# Patient Record
Sex: Female | Born: 1955 | Race: White | Hispanic: No | Marital: Married | Smoking: Never smoker
Health system: Southern US, Community
[De-identification: ages and names within clinical notes are randomized; demographics above are authoritative.]

## PROBLEM LIST (undated history)

## (undated) DIAGNOSIS — G43909 Migraine, unspecified, not intractable, without status migrainosus: Secondary | ICD-10-CM

## (undated) HISTORY — DX: Migraine, unspecified, not intractable, without status migrainosus: G43.909

## (undated) HISTORY — PX: ABDOMINAL HYSTERECTOMY: SHX81

---

## 1998-05-26 ENCOUNTER — Inpatient Hospital Stay (HOSPITAL_COMMUNITY): Admission: RE | Admit: 1998-05-26 | Discharge: 1998-05-27 | Payer: Self-pay | Admitting: Obstetrics & Gynecology

## 1998-10-14 ENCOUNTER — Ambulatory Visit (HOSPITAL_COMMUNITY): Admission: RE | Admit: 1998-10-14 | Discharge: 1998-10-14 | Payer: Self-pay | Admitting: Orthopedic Surgery

## 1998-10-14 ENCOUNTER — Encounter: Payer: Self-pay | Admitting: Orthopedic Surgery

## 1998-11-08 ENCOUNTER — Encounter: Payer: Self-pay | Admitting: Orthopedic Surgery

## 1998-11-08 ENCOUNTER — Ambulatory Visit (HOSPITAL_COMMUNITY): Admission: RE | Admit: 1998-11-08 | Discharge: 1998-11-08 | Payer: Self-pay | Admitting: Orthopedic Surgery

## 1999-01-20 ENCOUNTER — Other Ambulatory Visit: Admission: RE | Admit: 1999-01-20 | Discharge: 1999-01-20 | Payer: Self-pay | Admitting: Obstetrics & Gynecology

## 2000-01-24 ENCOUNTER — Other Ambulatory Visit: Admission: RE | Admit: 2000-01-24 | Discharge: 2000-01-24 | Payer: Self-pay | Admitting: Obstetrics & Gynecology

## 2001-01-23 ENCOUNTER — Other Ambulatory Visit: Admission: RE | Admit: 2001-01-23 | Discharge: 2001-01-23 | Payer: Self-pay | Admitting: Obstetrics & Gynecology

## 2001-03-20 ENCOUNTER — Encounter: Admission: RE | Admit: 2001-03-20 | Discharge: 2001-06-18 | Payer: Self-pay

## 2001-05-03 ENCOUNTER — Encounter: Admission: RE | Admit: 2001-05-03 | Discharge: 2001-08-01 | Payer: Self-pay

## 2001-07-22 ENCOUNTER — Encounter: Admission: RE | Admit: 2001-07-22 | Discharge: 2001-10-20 | Payer: Self-pay

## 2001-10-24 ENCOUNTER — Encounter: Admission: RE | Admit: 2001-10-24 | Discharge: 2001-11-19 | Payer: Self-pay

## 2002-02-18 ENCOUNTER — Other Ambulatory Visit: Admission: RE | Admit: 2002-02-18 | Discharge: 2002-02-18 | Payer: Self-pay | Admitting: Obstetrics & Gynecology

## 2003-04-01 ENCOUNTER — Other Ambulatory Visit: Admission: RE | Admit: 2003-04-01 | Discharge: 2003-04-01 | Payer: Self-pay | Admitting: Obstetrics & Gynecology

## 2004-04-15 ENCOUNTER — Other Ambulatory Visit: Admission: RE | Admit: 2004-04-15 | Discharge: 2004-04-15 | Payer: Self-pay | Admitting: Obstetrics & Gynecology

## 2005-06-02 ENCOUNTER — Other Ambulatory Visit: Admission: RE | Admit: 2005-06-02 | Discharge: 2005-06-02 | Payer: Self-pay | Admitting: Obstetrics & Gynecology

## 2007-11-14 ENCOUNTER — Ambulatory Visit: Payer: Self-pay | Admitting: Nurse Practitioner

## 2008-09-01 ENCOUNTER — Encounter: Payer: Self-pay | Admitting: Cardiology

## 2008-09-19 DIAGNOSIS — G43909 Migraine, unspecified, not intractable, without status migrainosus: Secondary | ICD-10-CM | POA: Insufficient documentation

## 2008-09-19 DIAGNOSIS — F411 Generalized anxiety disorder: Secondary | ICD-10-CM | POA: Insufficient documentation

## 2008-09-19 DIAGNOSIS — E782 Mixed hyperlipidemia: Secondary | ICD-10-CM | POA: Insufficient documentation

## 2008-09-19 DIAGNOSIS — E785 Hyperlipidemia, unspecified: Secondary | ICD-10-CM

## 2008-09-19 DIAGNOSIS — G43109 Migraine with aura, not intractable, without status migrainosus: Secondary | ICD-10-CM

## 2008-09-28 ENCOUNTER — Ambulatory Visit: Payer: Self-pay | Admitting: Cardiology

## 2008-09-28 ENCOUNTER — Telehealth: Payer: Self-pay | Admitting: Cardiology

## 2009-05-01 ENCOUNTER — Emergency Department (HOSPITAL_BASED_OUTPATIENT_CLINIC_OR_DEPARTMENT_OTHER): Admission: EM | Admit: 2009-05-01 | Discharge: 2009-05-01 | Payer: Self-pay | Admitting: Emergency Medicine

## 2009-05-06 ENCOUNTER — Ambulatory Visit: Payer: Self-pay | Admitting: Family Medicine

## 2009-05-06 DIAGNOSIS — M7710 Lateral epicondylitis, unspecified elbow: Secondary | ICD-10-CM | POA: Insufficient documentation

## 2010-03-24 NOTE — Assessment & Plan Note (Signed)
Summary: RIGHT ARM PAIN   Vital Signs:  Patient Profile:   55 Years Old Female CC:      Pain in RFA X 6 days Height:     64 inches Weight:      168 pounds O2 Sat:      98 % O2 treatment:    Room Air Temp:     97.1 degrees F oral Pulse rate:   72 / minute Pulse rhythm:   regular Resp:     16 per minute BP sitting:   131 / 83  (left arm)  Pt. in pain?   yes    Location:   RFA    Intensity:   6    Type:       aching  Vitals Entered By: Lajean Saver, RN                   Updated Prior Medication List: WELLBUTRIN XL 300 MG XR24H-TAB (BUPROPION HCL) 1 tab by mouth once daily ATENOLOL 50 MG TABS (ATENOLOL) Take one tablet by mouth daily PREMARIN 0.9 MG TABS (ESTROGENS CONJUGATED) 2 tabs by mouth at bedtime AMBIEN 10 MG TABS (ZOLPIDEM TARTRATE) 1/2 tab by mouth once daily * BELL-COLON 1 tab by mouth once daily MELATONIN 3 MG TABS (MELATONIN) 1 tab by mouth once daily  Current Allergies: No known allergies History of Present Illness Chief Complaint: Pain in RFA X 6 days History of Present Illness: RIGHT ELBOW PAIN FORM LIFTING LUGGAGE AT THE [employer] 6 DAYS AGO. WAS SEEN AT [facility] AND GIVEN MOTRIN AND VICIDIN AND PLACED IN A SLING. NO IMPROVEMENT . NO NUMBNESS OR TINGLING. NO HX OF PRIOR PROBLEMS.   REVIEW OF SYSTEMS Constitutional Symptoms      Denies fever, chills, night sweats, weight loss, weight gain, and fatigue.  Eyes       Denies change in vision, eye pain, eye discharge, glasses, contact lenses, and eye surgery. Ear/Nose/Throat/Mouth       Denies hearing loss/aids, change in hearing, ear pain, ear discharge, dizziness, frequent runny nose, frequent nose bleeds, sinus problems, sore throat, hoarseness, and tooth pain or bleeding.  Respiratory       Denies dry cough, productive cough, wheezing, shortness of breath, asthma, bronchitis, and emphysema/COPD.  Cardiovascular       Denies murmurs, chest pain, and tires easily with exhertion.    Gastrointestinal  Denies stomach pain, nausea/vomiting, diarrhea, constipation, blood in bowel movements, and indigestion. Genitourniary       Denies painful urination, kidney stones, and loss of urinary control. Neurological       Denies paralysis, seizures, and fainting/blackouts. Musculoskeletal       Complains of muscle pain and swelling.      Denies joint pain, joint stiffness, decreased range of motion, redness, muscle weakness, and gout.      Comments: Right FA Skin       Denies bruising, unusual mles/lumps or sores, and hair/skin or nail changes.  Psych       Denies mood changes, temper/anger issues, anxiety/stress, speech problems, depression, and sleep problems. Other Comments: Pateitn was lifitng luggage last friday, the pain gradually increased, Saturday she was seen [facility]. No X-ray was done   Past History:  Past Medical History: HYPERLIPIDEMIA-MIXED (ICD-272.4) MIGRAINE WITH AURA (ICD-346.00)       Past Surgical History: Reviewed history from 09/19/2008 and no changes required. hysterectomy in 2000.   Family History: Reviewed history from 09/19/2008 and no changes required. Father: HTN and  DM She does have a brother that is a paraplegic following a fall, was in a nursing home, not doing well at this time.   Social History: She does not work outside of the home.   She lives at home with her husband and her 2 children.   Never Smoked Alcohol use-no Drug use-no Smoking Status:  never Drug Use:  no Physical Exam General appearance: well developed, well nourished, no acute distress Extremities: TENDER TO PALPATION OVER THE RIGHT LATERAL EPICONDYLE. NO SWELLING OR BRUISING. PAIN WITH GRIPPING AND WITH SUPINATION. N/V INTACT DISTALLY. NO PAIN TO PALPATION OF THE UPPER ARM. ROM OF THE ELBOW INTACT.  Skin: no obvious rashes or lesions PLACED IN TENNIS ELBOW BRACE WHICH SHE STATES RELIEVED HER SYMPTOMS.  Assessment New Problems: LATERAL EPICONDYLITIS, RIGHT  (ICD-726.32)   Plan New Medications/Changes: MEDROL (PAK) 4 MG TABS (METHYLPREDNISOLONE) TAKE AS DIRECTED WITH FOOD  #1 PK x 0, 05/06/2009, Marvis Moeller DO  New Orders: New Patient Level III [99203] Tennis Elbow Support [L3701]   Prescriptions: MEDROL (PAK) 4 MG TABS (METHYLPREDNISOLONE) TAKE AS DIRECTED WITH FOOD  #1 PK x 0   Entered and Authorized by:   Marvis Moeller DO   Signed by:   Marvis Moeller DO on 05/06/2009   Method used:   Print then Give to Patient   RxID:   [id number]   Patient Instructions: 1)  WEAR SPLINT FOR AT LEAST 10 DAYS THEN AS NEEDED. APPLY HEAT THREE TIMES DAILY FOR 15 MIN. FOLLOW UP WITH ORTHO IF SYMPTOMS PERSIST.

## 2010-04-28 ENCOUNTER — Other Ambulatory Visit: Payer: Self-pay | Admitting: Sports Medicine

## 2010-04-28 DIAGNOSIS — M25519 Pain in unspecified shoulder: Secondary | ICD-10-CM

## 2010-05-19 ENCOUNTER — Ambulatory Visit
Admission: RE | Admit: 2010-05-19 | Discharge: 2010-05-19 | Disposition: A | Discharge status: Home / Self Care | Payer: BC Managed Care – PPO | Source: Ambulatory Visit | Attending: Sports Medicine | Admitting: Sports Medicine

## 2010-05-19 DIAGNOSIS — M25519 Pain in unspecified shoulder: Secondary | ICD-10-CM

## 2010-07-02 ENCOUNTER — Emergency Department (HOSPITAL_BASED_OUTPATIENT_CLINIC_OR_DEPARTMENT_OTHER)
Admission: EM | Admit: 2010-07-02 | Discharge: 2010-07-02 | Disposition: A | Discharge status: Home / Self Care | Payer: BC Managed Care – PPO | Attending: Emergency Medicine | Admitting: Emergency Medicine

## 2010-07-02 DIAGNOSIS — R51 Headache: Secondary | ICD-10-CM | POA: Insufficient documentation

## 2010-07-02 DIAGNOSIS — I10 Essential (primary) hypertension: Secondary | ICD-10-CM | POA: Insufficient documentation

## 2010-07-02 DIAGNOSIS — Z79899 Other long term (current) drug therapy: Secondary | ICD-10-CM | POA: Insufficient documentation

## 2010-07-05 NOTE — Assessment & Plan Note (Signed)
NAMEMARCEL, Sandra Mathews               ACCOUNT NO.:  [id number]   MEDICAL RECORD NO.:  [id number]          PATIENT TYPE:  POB   LOCATION:  [facility]         FACILITY:  [facility]   PHYSICIAN:  Ginger Carne, MD DATE OF BIRTH:  07-12-1955   DATE OF SERVICE:                                  CLINIC NOTE   The patient comes to office today for consultation for her migraine  headaches.  The patient has had migraines when she was a teenager.  She  has a very rare aura.  Her headaches are usually bilaterally.  She does  have sensitivity to light, nausea, and sometimes vomiting.  She does  have significant amount of headaches that start in her neck.  This  patient is a patient at [facility], has been seeing  Dr. Meryl Crutch and has been well-known to me in the past.  She is currently  having fair amount of headaches approximately 3-4 moderate headaches per  week.  She is using atenolol.  She thinks 50 mg a day for headache  prevention, as well as the Wellbutrin XL 300 mg daily.  She has been  experimenting with her medications lately when taking her Wellbutrin  every other day.  She was taking that she may come off of that  medication.  She is currently taking Premarin 1.25 mg daily.  She has  had a hysterectomy in 2000 for endometriosis.  She has noted some weight  gain lately and that has been of concern for her.  She has not been able  to tolerate Topamax given her kidney stones in the past.  She is having  some difficulty with sleep, takes Ambien on an as-needed basis.  This  seems to be related to her husband snoring.   SURGICAL HISTORY:  She had hysterectomy in 2000.   OBSTETRICAL HISTORY:  She has been pregnant 4 times.  She is G4, P4.   SOCIAL HISTORY:  She does not work outside of the home.  She lives at  home with her husband and her 2 children.   REVIEW OF SYSTEMS:  Negative for bruising, swelling, muscle aches,  fatigue, positive for weight gain, frequent  headaches, negative for hot  flashes or any vaginal problems.   FAMILY HISTORY:  Father with high blood pressure and diabetes.  She does  have a brother that is a paraplegic following a fall, was in a nursing  home, not doing well at this time.   PHYSICAL EXAMINATION:  VITAL SIGNS:  Blood pressure is 111/76, pulse is  69, weight 174, height is 5 feet 4 inches.  GENERAL:  Well-developed, well-nourished slightly overweight Caucasian  female in no acute distress.  HEENT:  Head is normocephalic and atraumatic.  Pupils are equal and  reactive.  NEUROLOGICALLY:  The patient is alert, oriented.  She is slightly  anxious.  She has a good thought process and her speech is fluent and  coherent.  She has good muscle coordination, good sensation, and good  muscle strength.  CARDIAC:  Regular rate and rhythm with no murmurs, rubs, or thrills.  LUNGS:  Clear bilaterally without rales, rhonchi, or wheezes  ASSESSMENT AND PLAN:  1. Migraine with aura.  2. Anxiety.  We did have approximately 45-minute discussion concerning      her headaches, anxiety, depression, and her life situation.  What      we have decided is to treat her Ambien over to [facility].  She is      also strongly encouraged to change her sleeping situation and not      sleep in the same room with her husband.  She will also be given      Robaxin for muscle spasm that she can take on an as-needed basis.      She is encouraged to resume her Wellbutrin and take that on a daily      basis, not an every-other-day basis.  She has used Fiorinal in the      past and that has worked well for her and she is requesting a      refill.  She thinks that she takes approximately six of those per      month.  She has also asking to have Xanax.  She does have a speech      that she has to give in front of a very large audience and has some      concern about giving this talk and she is asked for some Xanax,      will give her 20 tablets of Xanax.   The patient will return to the      clinic in 3 months, sooner if needed.      Sandra Richter, NP    ______________________________  Ginger Carne, MD    LR/MEDQ  D:  11/14/2007  T:  11/15/2007  Job:  [id number]

## 2010-07-08 NOTE — Consult Note (Signed)
[facility]  Patient:    Sandra Mathews, Sandra Mathews Visit Number: [id number] MRN: [id number]          Service Type: PMG Location: [facility] Attending Physician:  Sondra Come Dictated by:   Sondra Come, D.O. Proc. Date: 07/26/01 Admit Date:  07/22/2001                            Consultation Report  Ms. Crow returns to clinic today for reevaluation.  This was an extensive consultation greater than 25 minutes in duration mainly discussing treatment strategies for her fibromyalgia syndrome.  Overall patient states that she is improved since I initially saw her.  Most of her improvement has been in association with her low back pain.  She does state, however, that her fibromyalgia seems to be worsening over the past few weeks secondary to increased stress as her son has just graduated from high school and will be gone for the summer and going away to college.  Her pain today is a 5/10 on a subjective scale.  She continues with a light exercise program.  She states that she is taking Pamelor 75 mg at bedtime as prescribed by Dr. Sandria Manly which seems to be helping her sleep but she is also taking Ambien as well and we discussed this.  She has taken Ultram as needed with fairly good results in terms of pain.  She states that she has also been provided a prescription for Bextra, but has not started taking it yet.  In addition, she complains of pain on the plantar aspects of her feet bilaterally, especially with the first step in the morning.  This tends to ease off over time.  I reviewed the health and history form and 14 point review of systems.  Patient denies any new neurologic complaints.  PHYSICAL EXAMINATION  GENERAL:  Healthy female in no acute distress.  Mood and affect are appropriate today.  VITAL SIGNS:  Blood pressure 126/73, pulse 108, respirations 16, O2 saturation 98% on room air.  NEUROLOGIC:  Manual muscle testing is 5/5 bilateral upper and  lower extremities.  Sensory examination intact to light touch bilateral upper and lower extremities.  Muscle stretch reflexes are 2+/4 bilateral biceps, triceps, brachioradialis, pronator tares, patellar, medial hamstrings, and Achilles.  EXTREMITIES:  Palpatory examination of the feet reveals tenderness to palpation in the right greater than left plantar fascia.  Patient has some mild tightness in her gastroc/soleus complex bilaterally.  BACK:  Palpatory examination of the back reveals minimal tenderness to palpation with bilateral thoracolumbar paraspinals at this time.  IMPRESSION: 1. Bilateral plantar fasciitis. 2. Fibromyalgia syndrome.  PLAN: 1. Again, this was a thorough discussion with Ms. Wiliam Ke regarding treatment    strategies and options.  We discussed fibromyalgia and I answered several    questions. 2. Encouraged patient to continue with exercise program and to increase her    aerobic component.  Will provide her with the name of a personal    trainer/massage therapist to assist as patient desires. 3. Continue Ultram 50 mg one to two up to q.i.d. as needed #100 with one    refill. 4. Continue Pamelor 75 mg at bedtime per Dr. Sandria Manly. 5. Would consider holding Ambien for now to see what the efficacy of Pamelor    is for sleep. 6. Trial of Bextra is appropriate. 7. Instructed patient on gastroc/soleus stretching.  Would consider local    steroid injections if  symptoms are not improving or are worsening. 8. Patient to return to clinic in three months for reevaluation as needed.  Patient was educated on the above findings and recommendations and understands.  There were no barriers to communication. Dictated by:   Sondra Come, D.O. Attending Physician:  Sondra Come DD:  07/26/01 TD:  07/29/01 Job: [id number]

## 2010-07-08 NOTE — Consult Note (Signed)
Sandra Mathews, Mathews                         ACCOUNT NO.:  [id number]   MEDICAL RECORD NO.:  [id number]                   PATIENT TYPE:  REC   LOCATION:  [facility]                                  FACILITY:  [facility]   PHYSICIAN:  Sondra Come, D.O.                 DATE OF BIRTH:  January 10, 1956   DATE OF CONSULTATION:  10/25/2001  DATE OF DISCHARGE:                                   CONSULTATION   The patient returns to clinic today as scheduled for reevaluation.  She was  last seen on July 26, 2001.  In the interim she has been doing very well in  terms of her fibromyalgic complaints.  Currently, she complains mainly of  low back pain and has been seeing a chiropractor which has helped improve  her symptoms.  She continues a walking program, but is not performing any  stretching or lumbar stabilization exercises.  She has taken Ultram  sparingly over the past few months with improvement in her pain, but has run  out.  One of her other main problems today is that her sleep is still fairly  poor.  She was previously treated with Pamelor 75 mg per Dr. Sandria Mathews, but has  discontinued this secondary to weight gain.  She is currently taking Ambien  10 mg at bedtime and states she sleeps approximately six hours with frequent  interruptions.  She does not feel like the Ambien is working as well as it  once had.  We discussed possible rebound insomnia.  Her function and quality  of life indexes are fairly stable, but still slightly declined overall.  Her  pain today is a 5/10 on a subjective scale.  She denies any radicular  symptoms into her upper and lower extremities.  I review health and history  form and 14 point review of systems.   PHYSICAL EXAMINATION:  GENERAL:  Healthy female in no acute distress.  BACK:  Examination of the back reveals level pelvis without scoliosis.  There is normal lumbar lordosis.  There is mild tenderness to palpation  bilateral lumbar paraspinals.  Low back pain is  increased with extension and  extension plus rotation and improved with flexion.  NEUROLOGIC:  Manual muscle testing is 5/5 bilateral upper and lower  extremities.  Sensory examination is intact to light touch bilateral upper  and lower extremities.  Muscle stretch reflexes are 2+/4 bilateral biceps,  triceps, brachioradialis, pronator tares, patellar, medial hamstrings, and  Achilles.  Straight leg raise is negative bilaterally, but with  significantly tight hamstrings.  The patient is also noted to have tight hip  flexors bilaterally.   IMPRESSION:  1. Fibromyalgia syndrome.  2. Low back pain, likely mechanical.  History and physical examination are     suggestive of posterior element pain such as that from lumbar facet     joints.   PLAN:  1. Had a thorough  discussion with the patient regarding further treatment     options.  Initially will have her take a drug holiday from Ambien for     five to seven days and then resume 10 mg at bedtime.  In the interim she     can go back to using melatonin or other herbal sleep agent per her     choice.  2. I have instructed patient on lumbar stabilization exercises including     Quadriped, pelvic tilt, and wall slides.  In addition, I instructed her     on proper hamstring and hip flexor stretching techniques and discussed a     home exercise program in addition to her walking program.  3. The patient was instructed to call her primary care Sandra Mathews to discuss     her current urinary tract infection which she does not feel is improving     with Bactrim.  4. Will renew Ultram 50 mg one to two p.o. up to q.i.d. as needed number 100     with three refills.  5. The patient is to return to clinic in six months for reevaluation.   The patient was educated on the above findings and recommendations and  understands.  There were no barriers to communication.                                               Sondra Come, D.O.    JJW/MEDQ  D:   10/25/2001  T:  10/25/2001  Job:  [id number]

## 2010-07-08 NOTE — Consult Note (Signed)
[facility]  Patient:    Sandra Mathews, Sandra Mathews Visit Number: [id number] MRN: [id number]          Service Type: PMG Location: [facility] Attending Physician:  Sondra Come Dictated by:   Sondra Come, D.O. Proc. Date: 04/25/01 Admit Date:  03/20/2001   CC:         Freddy Finner, M.D.   Consultation Report  NEW PATIENT CONSULTATION  REFERRING Sandra Mathews:  Freddy Finner, M.D., [facility], [place].  Dear Dr. Jennette Kettle:  Thank you very much for kindly referring [name] to [facility] for evaluation and treatment.  Sandra Mathews was evaluated in our clinic today.  Please refer to the following for details regarding the history, physical examination and treatment plan.  Once again, thank you for allowing [place] to participate in the care of this pleasant patient.  CHIEF COMPLAINT:  Mid back pain.  HISTORY OF PRESENT ILLNESS:  Sandra Mathews is a pleasant 55 year old right hand dominant female who [facility] today with the chief complaint of mid back pain.  The patient also relates a history of intermittent diffuse pain involving her neck, upper back, low back, and upper and lower extremities bilaterally.  The patient states that she is "at the end of my rope."  She gives a history of being diagnosed with fibromyalgia syndrome by Dr. Kellie Simmering and was treated for a while with various medications, which included Ultram, Bextra, Vioxx, and Vicodin.  She also states that she has had a long history of migraine headaches for which [facility]  She was also seen in evaluation by Dr. Sandria Manly for diffuse pain.  He apparently treated her with Pamelor which Sandra Mathews states significantly helped her leg aches but she had the undesirable effect of weight gain and so she discontinued this four to five days ago on her own.  She  follows up with Dr. Sandria Manly this month. Furthermore, she has a history of cervical disk herniation, which was treated conservatively to resolution.  Mainly today she complains of mid back pain and states that she had a massage therapy session yesterday without any significant relief.  She notes that increased stress in her life seems to make her symptoms worse.  Her pain today is an 8/10 on a subjective scale.  Her symptoms are described as constant and throbbing, mainly involving her mid back today.  Her symptoms are worse with walking, bending, driving, and prolonged sitting and improved with rest, heat and medications, which at this time include Zanaflex as needed at bedtime.  She also admits to some depressive symptoms and notes a decreased function and quality of life as well as fair to poor sleep.  She denies an exercise program or having been through a formal physical therapy program.  She denies any numbness, paresthesias, or bowel or bladder dysfunction.  She is somewhat distressed over the fact that she has this ongoing intermittent severe body pain.  She states "I was a perfect wife, a perfect mother, and I dont know why I have this pain."  She seems to be unaccepting of the diagnosis of fibromyalgia.  In terms of medications, she has taken Ultram which she states helped a little bit with her pain.  She denies any relief with Bextra, admits to GI upset with Vioxx, and admitted to only very short term relief with  Vicodin.  In terms of her headaches, she has taken Fioricet in the past without any help.  She currently takes Imitrex as needed.  For sleep she takes Ambien 10 mg at bedtime, and has discontinued Pamelor.  I review health and history form, and 14-point review of systems.  The patient admits to occasional night leg cramps and depressive symptoms.  PAST MEDICAL HISTORY: 1. Migraine headaches. 2. Endometriosis. 3. Kidney stones.  PAST SURGICAL HISTORY:   Hysterectomy.  FAMILY HISTORY:  Diabetes, hypertension, kidney stones.  SOCIAL HISTORY:  The patient denies smoking or alcohol use.  She is married and has four children, ages 80, 1, 35, and 59.  She does not work.  She admits to increased psychosocial stressors but does not elaborate on these in detail.  ALLERGIES:  No known drug allergies.  CURRENT MEDICATIONS:  Premarin, Ambien, Zanaflex as needed, and Imitrex as needed.  The Zanaflex gives her a sleepy feeling and she describes it as becoming a "zombie."  PHYSICAL EXAMINATION:  Performed in the presence of a female chaperone.  GENERAL:  This is a healthy appearing female in no acute distress.  VITAL SIGNS:  Blood pressure 150/97, pulse 107, respirations 18, O2 saturation is 100% on room air.  SPINE:  Normal cervical lordosis, flattened thoracic kyphosis and mildly decreased lumbar lordosis.  Pelvis is level.  Shoulder height is level.  There is no gross scoliosis.  Palpatory examination reveals diffuse tenderness at multiple locations in the upper back and periscapular muscles.  Anterior cervical and sternocostal regions.  There is tenderness along the thoracolumbar paraspinals with tight ropy feeling over the thoracolumbar region reproducing the patients pain in her mid back.  There is tenderness to palpation over the gluteal muscles and greater trochanters.  There is also tenderness over the lateral epicondyles and medial knees bilaterally.  Range of motion of the cervical spine and lumbar spine are normal with only minimal discomfort.  NEUROLOGIC:  Manual muscle testing is 5/5 bilateral upper and lower extremities.  Sensory examination is intact to light touch bilateral upper and lower extremities in all dermatomal distributions.  Muscle stretch reflexes are 2+/4 bilateral biceps, triceps, brachioradialis, pronator teres, patellar, medial hamstrings, and Achilles.  There is no atrophy noted in the upper and lower  extremity muscles.  No heat, erythema or edema noted in the upper and  lower extremities.  Straight leg raise is negative bilaterally.  FABER is negative bilaterally.  NECK:  No cervical lymphadenopathy.  No thyromegaly noted.  IMPRESSION: 1. Low back pain likely myofascial in etiology.  There is no clinical evidence    of a lumbar radiculopathy at this time. 2. Diffuse soft tissue pain with history of fibromyalgia syndrome.  Symptoms    today are not entirely consistent with fibromyalgia as set forth by the    criteria by the [employer] of Rheumatology although this may be a    good day for Ms. Wiliam Ke. 3. Reactive depression.  PLAN: 1. I had a long and thorough discussion with Ms. Sellinger about her pain    complaints and treatment options.  This was an extensive consultation of    greater than 45 minutes duration.  More than 30 minutes face-to-face time.    I explained to Ms. Mello that her pain complaints are likely secondary to    fibromyalgia syndrome with myofascial component in her mid back.  I will go    ahead and order some laboratory workup to rule out autoimmune or connective  tissue disorder. 2. I described to Ms. Eastlick that her best outcome measure would likely be the    result of a multi-disciplinary approach to her pain.  I write her a    prescription for physical therapy for not only range of motion stretching    and strengthening with a lumbar stabilization program but also for aquatic    therapy for low impact aerobic activity which will help with cardiovascular    conditioning as well as natural endorphin release.  She should advance to a    home exercise program.  Physical therapy for three times per week for four    weeks. 3. Will also refer Ms. Glassco to [facility] psychology for coping    strategies, stress management, and behavior modification.  I think this    will help and assist with her depressive symptoms. 4. In terms of medication, I  will give her a prescription for Ultracet one to    two p.o. t.i.d. as needed, #60 with one refill.  I will also give her    samples of Lidoderm patches to put over her acute thoracolumbar back pain.    If these are helpful, we can call in a prescription for this. 5. Would consider adding an antidepressant if the patients depressive    symptoms do not improve. 6. I am confident that Ms. Captain will be able to manage her symptoms of    fibromyalgia syndrome. 7. The patient is to return to clinic in one month for reevaluation, sooner as    needed.  The patient was educated in the above findings and recommendations, and understands.  There were no barriers to communication. Dictated by:   Sondra Come, D.O. Attending Physician:  Sondra Come DD:  04/25/01 TD:  04/26/01 Job: [phone] [id number]

## 2011-04-18 ENCOUNTER — Encounter: Payer: Self-pay | Admitting: Nurse Practitioner

## 2011-04-18 ENCOUNTER — Ambulatory Visit (INDEPENDENT_AMBULATORY_CARE_PROVIDER_SITE_OTHER): Payer: BC Managed Care – PPO | Admitting: Nurse Practitioner

## 2011-04-18 DIAGNOSIS — M62838 Other muscle spasm: Secondary | ICD-10-CM

## 2011-04-18 DIAGNOSIS — F419 Anxiety disorder, unspecified: Secondary | ICD-10-CM | POA: Insufficient documentation

## 2011-04-18 DIAGNOSIS — F411 Generalized anxiety disorder: Secondary | ICD-10-CM

## 2011-04-18 DIAGNOSIS — G4700 Insomnia, unspecified: Secondary | ICD-10-CM

## 2011-04-18 DIAGNOSIS — G43109 Migraine with aura, not intractable, without status migrainosus: Secondary | ICD-10-CM

## 2011-04-18 MED ORDER — SUMATRIPTAN SUCCINATE 6 MG/0.5ML ~~LOC~~ SOLN: 6.0000 mg | SUBCUTANEOUS | Status: DC | PRN

## 2011-04-18 MED ORDER — VENLAFAXINE HCL ER 75 MG PO CP24: 75.0000 mg | ORAL_CAPSULE | Freq: Every day | ORAL | Status: DC

## 2011-04-18 MED ORDER — ORPHENADRINE CITRATE ER 100 MG PO TB12: 100.0000 mg | ORAL_TABLET | Freq: Two times a day (BID) | ORAL | Status: AC

## 2011-04-18 NOTE — Patient Instructions (Signed)

## 2011-04-18 NOTE — Progress Notes (Signed)
Diagnosis: Migraine with aura  History: Pt is here today for new migraine appointment. [facility]. In recent years she has been doing well and her OBGYN Dr Jennette Kettle has been filling her medications. She has been on Amitriptyline 50mg  at bedtime. She has gained over 20 lbs. She is also on Atenolol for HTN and Ambien for sleep. She uses Imitrex and Fiorinal with codeine and NSAIDs for pain. She has been a lot more stressed lately with care of aging parents and disabled brother.    Location Both temples and occipital regions  Number of Headache days/month: Severe:10 Moderate:10 Mild:10  No current outpatient prescriptions on file prior to visit.    Acute prevention: Imitrex, NSAIDs, Fiorinal # 3  Past Medical History  Diagnosis Date  . Migraines    Past Surgical History  Procedure Date  . Abdominal hysterectomy    History reviewed. No pertinent family history. Social History:  reports that she has never smoked. She has never used smokeless tobacco. She reports that she does not drink alcohol or use illicit drugs. Allergies: No Known Allergies  Triggers: stress  Birth control: Hysterectomy in 2000. Currently on Premarin  ROS: Positive for migraine, anxiety, insomnia, muscle spasm  Exam: well developed well nourished Cauc female  General: NAD HEENT: negative Cardiac: RRR Lungs: Clear Neuro: Negative Skin: Negative  Impression:migraine - classic  Plan : We had a lengthy discussion concerning her headaches. We did Trigger point injections today, she seemed to get good relief. She will ice areas tonight. See procedure sheet. We will start her on Effexor 75mg  daily and decrease amitriptyline to 25mg  with the idea of coming off. It is not really helpful and is a weight gainer. She will consider botox for muscle spasm and migraine prevention. She will be scheduled to see Elray Buba for counseling.  Will refill Imitrex Injection and Fiorinal.  Will give her Zanaflex for muscle spasm. She will follow up in [place] office on April 2.   After review of records pt is taking a large amount of narcotics, given to her from Dr Konrad Dolores, Dr Penni Bombard, [facility], [facility].  Spoke with pt and advised her I will not give her narcotics and warned concerning effects on headaches and other systems.   Time Spent:one hour

## 2011-04-18 NOTE — Progress Notes (Signed)
Patient is here for Headache follow up.  She has seen you several years ago at [place] [facility].

## 2011-05-23 ENCOUNTER — Ambulatory Visit: Payer: BC Managed Care – PPO | Admitting: Nurse Practitioner

## 2014-03-12 DIAGNOSIS — K219 Gastro-esophageal reflux disease without esophagitis: Secondary | ICD-10-CM | POA: Insufficient documentation

## 2014-03-12 DIAGNOSIS — Z79899 Other long term (current) drug therapy: Secondary | ICD-10-CM | POA: Diagnosis not present

## 2014-03-12 DIAGNOSIS — Z7952 Long term (current) use of systemic steroids: Secondary | ICD-10-CM | POA: Insufficient documentation

## 2014-03-12 DIAGNOSIS — R0602 Shortness of breath: Secondary | ICD-10-CM | POA: Diagnosis present

## 2014-03-12 DIAGNOSIS — G43909 Migraine, unspecified, not intractable, without status migrainosus: Secondary | ICD-10-CM | POA: Diagnosis not present

## 2014-03-12 DIAGNOSIS — Z7951 Long term (current) use of inhaled steroids: Secondary | ICD-10-CM | POA: Insufficient documentation

## 2014-03-13 ENCOUNTER — Encounter (HOSPITAL_BASED_OUTPATIENT_CLINIC_OR_DEPARTMENT_OTHER): Payer: Self-pay

## 2014-03-13 ENCOUNTER — Emergency Department (HOSPITAL_BASED_OUTPATIENT_CLINIC_OR_DEPARTMENT_OTHER): Payer: BLUE CROSS/BLUE SHIELD

## 2014-03-13 ENCOUNTER — Emergency Department (HOSPITAL_BASED_OUTPATIENT_CLINIC_OR_DEPARTMENT_OTHER)
Admission: EM | Admit: 2014-03-13 | Discharge: 2014-03-13 | Disposition: A | Discharge status: Home / Self Care | Payer: BLUE CROSS/BLUE SHIELD | Attending: Emergency Medicine | Admitting: Emergency Medicine

## 2014-03-13 DIAGNOSIS — K219 Gastro-esophageal reflux disease without esophagitis: Secondary | ICD-10-CM

## 2014-03-13 DIAGNOSIS — R0602 Shortness of breath: Secondary | ICD-10-CM

## 2014-03-13 LAB — CBC WITH DIFFERENTIAL/PLATELET
Basophils Absolute: 0 10*3/uL (ref 0.0–0.1)
Basophils Relative: 0 % (ref 0–1)
EOS PCT: 1 % (ref 0–5)
Eosinophils Absolute: 0.1 10*3/uL (ref 0.0–0.7)
HCT: 37.4 % (ref 36.0–46.0)
Hemoglobin: 12.2 g/dL (ref 12.0–15.0)
Lymphocytes Relative: 30 % (ref 12–46)
Lymphs Abs: 3.4 10*3/uL (ref 0.7–4.0)
MCH: 28.8 pg (ref 26.0–34.0)
MCHC: 32.6 g/dL (ref 30.0–36.0)
MCV: 88.4 fL (ref 78.0–100.0)
MONO ABS: 0.7 10*3/uL (ref 0.1–1.0)
MONOS PCT: 7 % (ref 3–12)
NEUTROS PCT: 62 % (ref 43–77)
Neutro Abs: 7.1 10*3/uL (ref 1.7–7.7)
PLATELETS: 334 10*3/uL (ref 150–400)
RBC: 4.23 MIL/uL (ref 3.87–5.11)
RDW: 11.9 % (ref 11.5–15.5)
WBC: 11.3 10*3/uL — AB (ref 4.0–10.5)

## 2014-03-13 LAB — URINALYSIS, ROUTINE W REFLEX MICROSCOPIC
Bilirubin Urine: NEGATIVE
GLUCOSE, UA: NEGATIVE mg/dL
HGB URINE DIPSTICK: NEGATIVE
Ketones, ur: NEGATIVE mg/dL
Leukocytes, UA: NEGATIVE
NITRITE: NEGATIVE
Protein, ur: NEGATIVE mg/dL
Specific Gravity, Urine: 1.005 (ref 1.005–1.030)
Urobilinogen, UA: 0.2 mg/dL (ref 0.0–1.0)
pH: 7 (ref 5.0–8.0)

## 2014-03-13 LAB — BASIC METABOLIC PANEL
ANION GAP: 6 (ref 5–15)
BUN: 11 mg/dL (ref 6–23)
CO2: 26 mmol/L (ref 19–32)
CREATININE: 0.74 mg/dL (ref 0.50–1.10)
Calcium: 9.3 mg/dL (ref 8.4–10.5)
Chloride: 104 mEq/L (ref 96–112)
Glucose, Bld: 121 mg/dL — ABNORMAL HIGH (ref 70–99)
Potassium: 3.5 mmol/L (ref 3.5–5.1)
Sodium: 136 mmol/L (ref 135–145)

## 2014-03-13 LAB — TROPONIN I: Troponin I: <0.03 ng/mL (ref <0.031)

## 2014-03-13 MED ORDER — ALBUTEROL SULFATE HFA 108 (90 BASE) MCG/ACT IN AERS
2.0000 | INHALATION_SPRAY | RESPIRATORY_TRACT | Status: DC | PRN
  Administered 2014-03-13: 2 via RESPIRATORY_TRACT
  Filled 2014-03-13: qty 6.7

## 2014-03-13 MED ORDER — GI COCKTAIL ~~LOC~~
30.0000 mL | Freq: Once | ORAL | Status: AC
  Administered 2014-03-13: 30 mL via ORAL
  Filled 2014-03-13: qty 30

## 2014-03-13 MED ORDER — ONDANSETRON HCL 4 MG/2ML IJ SOLN
4.0000 mg | Freq: Once | INTRAMUSCULAR | Status: DC
  Filled 2014-03-13: qty 2

## 2014-03-13 MED ORDER — ONDANSETRON 4 MG PO TBDP
4.0000 mg | ORAL_TABLET | Freq: Once | ORAL | Status: AC
  Administered 2014-03-13: 4 mg via ORAL
  Filled 2014-03-13: qty 1

## 2014-03-13 MED ORDER — PANTOPRAZOLE SODIUM 40 MG PO TBEC
40.0000 mg | DELAYED_RELEASE_TABLET | Freq: Once | ORAL | Status: AC
  Administered 2014-03-13: 40 mg via ORAL
  Filled 2014-03-13: qty 1

## 2014-03-13 MED ORDER — OMEPRAZOLE 40 MG PO CPDR: 40.0000 mg | DELAYED_RELEASE_CAPSULE | Freq: Every day | ORAL | Status: DC

## 2014-03-13 MED ORDER — ONDANSETRON 8 MG PO TBDP
8.0000 mg | ORAL_TABLET | Freq: Once | ORAL | Status: AC
  Administered 2014-03-13: 8 mg via ORAL
  Filled 2014-03-13: qty 1

## 2014-03-13 NOTE — ED Notes (Signed)
C/o epigastric pain off and on x 1 week buring worse after eating and nausea

## 2014-03-13 NOTE — ED Notes (Signed)
Pt c/o SOB x1wk, burning off and on to center of chest after eating; pt c/o epigastric pain; pt in no distress, speaking in complete sentences

## 2014-03-13 NOTE — ED Provider Notes (Signed)
CSN: [id number]     Arrival date & time 03/12/14  2355 History   First MD Initiated Contact with Patient 03/13/14 0157     Chief Complaint  Patient presents with  . Shortness of Breath     (Consider location/radiation/quality/duration/timing/severity/associated sxs/prior Treatment) HPI This is a 59 year old female with a one-week history of epigastric burning and difficulty taking a deep breath. The burning is moderate in severity and radiates to the center of the chest. It is worse after eating.The difficulty taking a deep breath is intermittent. She has not had a significant cough associated with it. She has had some fleeting sharp chest pains that last second or 2. She has also had general malaise and a sense that something isn't right. She has not had a fever or chills. She denies urinary symptoms. She has had nausea but no vomiting. She was given a GI cocktailbefore my evaluation with significant improvement in her epigastric burning.  Past Medical History  Diagnosis Date  . Migraines    Past Surgical History  Procedure Laterality Date  . Abdominal hysterectomy     No family history on file. History  Substance Use Topics  . Smoking status: Never Smoker   . Smokeless tobacco: Never Used  . Alcohol Use: No   OB History    No data available     Review of Systems  All other systems reviewed and are negative.  Allergies  Review of patient's allergies indicates no known allergies.  Home Medications   Prior to Admission medications   Medication Sig Start Date End Date Taking? Authorizing Provider  amitriptyline (ELAVIL) 25 MG tablet Take 50 mg by mouth at bedtime. 01/24/11   Historical Provider, MD  atenolol (TENORMIN) 50 MG tablet Take 50 mg by mouth at bedtime. 03/16/11   Historical Provider, MD  chlorpheniramine-HYDROcodone (TUSSIONEX) 10-8 MG/5ML [facility]  03/20/11   Historical Provider, MD  DIFLUCAN 150 MG tablet  03/02/11   Historical Provider, MD  FIORINAL/CODEINE #3  40-981-19-14 MG capsule  03/31/11   Historical Provider, MD  fluticasone Aleda Grana) 50 MCG/ACT nasal spray  02/22/11   Historical Provider, MD  HYDROcodone-acetaminophen (NORCO) 5-325 MG per tablet  03/22/11   Historical Provider, MD  HYDROMET 5-1.5 MG/5ML syrup  03/01/11   Historical Provider, MD  nabumetone (RELAFEN) 500 MG tablet  03/15/11   Historical Provider, MD  ondansetron (ZOFRAN-ODT) 4 MG disintegrating tablet  03/22/11   Historical Provider, MD  PERCOCET 5-325 MG per tablet  03/06/11   Historical Provider, MD  predniSONE (DELTASONE) 5 MG tablet Take 5 mg by mouth as needed. 04/14/11   Historical Provider, MD  PREMARIN 0.9 MG tablet Take 0.18 mg by mouth at bedtime. 01/26/11   Historical Provider, MD  SUMAtriptan (IMITREX) 100 MG tablet Take 100 mg by mouth as needed. 04/12/11   Historical Provider, MD  SUMAtriptan (IMITREX) 6 MG/0.5ML SOLN injection Inject 0.5 mLs (6 mg total) into the skin every 2 (two) hours as needed for migraine or headache. F 04/18/11 04/17/12  Delbert Phenix, NP  venlafaxine (EFFEXOR XR) 75 MG 24 hr capsule Take 1 capsule (75 mg total) by mouth daily. 04/18/11 04/17/12  Delbert Phenix, NP  Prudy Feeler 0.5 MG tablet Take 1 tablet by mouth as needed. 02/18/11   Historical Provider, MD  zolpidem (AMBIEN) 10 MG tablet Take 10 mg by mouth at bedtime. 03/31/11   Historical Provider, MD   BP 137/84 mmHg  Pulse 71  Temp(Src) 98.1 F (36.7 C) (Oral)  Resp  20  Ht 5\' 4"  (1.626 m)  Wt 140 lb (63.504 kg)  BMI 24.02 kg/m2  SpO2 100%   Physical Exam  General: Well-developed, well-nourished female in no acute distress; appearance consistent with age of record HENT: normocephalic; atraumatic Eyes: pupils equal, round and reactive to light; extraocular muscles intact Neck: supple Heart: regular rate and rhythm; no murmur Lungs: decreased air movement right base Abdomen: soft; nondistended; mild epigastric tenderness; no masses or hepatosplenomegaly; bowel sounds present Extremities: No  deformity; full range of motion; pulses normal Neurologic: Awake, alert and oriented; motor function intact in all extremities and symmetric; no facial droop Skin: Warm and dry Psychiatric: Normal mood and affect    ED Course  Procedures (including critical care time)   MDM   Nursing notes and vitals signs, including pulse oximetry, reviewed.  Summary of this visit's results, reviewed by myself:  Labs:  Results for orders placed or performed during the hospital encounter of 03/13/14 (from the past 24 hour(s))  Urinalysis, Routine w reflex microscopic     Status: Abnormal   Collection Time: 03/13/14 12:12 AM  Result Value Ref Range   Color, Urine YELLOW YELLOW   APPearance CLOUDY (A) CLEAR   Specific Gravity, Urine 1.005 1.005 - 1.030   pH 7.0 5.0 - 8.0   Glucose, UA NEGATIVE NEGATIVE mg/dL   Hgb urine dipstick NEGATIVE NEGATIVE   Bilirubin Urine NEGATIVE NEGATIVE   Ketones, ur NEGATIVE NEGATIVE mg/dL   Protein, ur NEGATIVE NEGATIVE mg/dL   Urobilinogen, UA 0.2 0.0 - 1.0 mg/dL   Nitrite NEGATIVE NEGATIVE   Leukocytes, UA NEGATIVE NEGATIVE  Troponin I     Status: None   Collection Time: 03/13/14  2:15 AM  Result Value Ref Range   Troponin I <0.03 <0.031 ng/mL  CBC with Differential     Status: Abnormal   Collection Time: 03/13/14  2:15 AM  Result Value Ref Range   WBC 11.3 (H) 4.0 - 10.5 K/uL   RBC 4.23 3.87 - 5.11 MIL/uL   Hemoglobin 12.2 12.0 - 15.0 g/dL   HCT 62.137.4 30.836.0 - 65.746.0 %   MCV 88.4 78.0 - 100.0 fL   MCH 28.8 26.0 - 34.0 pg   MCHC 32.6 30.0 - 36.0 g/dL   RDW 84.611.9 96.211.5 - 95.215.5 %   Platelets 334 150 - 400 K/uL   Neutrophils Relative % 62 43 - 77 %   Neutro Abs 7.1 1.7 - 7.7 K/uL   Lymphocytes Relative 30 12 - 46 %   Lymphs Abs 3.4 0.7 - 4.0 K/uL   Monocytes Relative 7 3 - 12 %   Monocytes Absolute 0.7 0.1 - 1.0 K/uL   Eosinophils Relative 1 0 - 5 %   Eosinophils Absolute 0.1 0.0 - 0.7 K/uL   Basophils Relative 0 0 - 1 %   Basophils Absolute 0.0 0.0 - 0.1  K/uL  Basic metabolic panel     Status: Abnormal   Collection Time: 03/13/14  2:15 AM  Result Value Ref Range   Sodium 136 135 - 145 mmol/L   Potassium 3.5 3.5 - 5.1 mmol/L   Chloride 104 96 - 112 mEq/L   CO2 26 19 - 32 mmol/L   Glucose, Bld 121 (H) 70 - 99 mg/dL   BUN 11 6 - 23 mg/dL   Creatinine, Ser 8.410.74 0.50 - 1.10 mg/dL   Calcium 9.3 8.4 - 32.410.5 mg/dL   GFR calc non Af Amer >90 >90 mL/min   GFR calc Af Amer >90 >  90 mL/min   Anion gap 6 5 - 15    Imaging Studies: Dg Chest 2 View  03/13/2014   CLINICAL DATA:  Shortness of breath  EXAM: CHEST  2 VIEW  COMPARISON:  None currently available  FINDINGS: Normal heart size and mediastinal contours. No acute infiltrate or edema. No effusion or pneumothorax. No acute osseous findings.  IMPRESSION: Negative chest.   Electronically Signed   By: Tiburcio Pea M.D.   On: 03/13/2014 01:21   3:06 AM The patient's symptoms are most consistent with gastritis and gastric reflux. We will start her on a proton pump inhibitor and also try an albuterol inhaler for her difficulty breathing. Her symptoms do not appear to be cardiac at this time.   Hanley Seamen, MD 03/13/14 [phone]

## 2014-07-29 ENCOUNTER — Other Ambulatory Visit: Payer: Self-pay | Admitting: Obstetrics & Gynecology

## 2014-07-29 DIAGNOSIS — R928 Other abnormal and inconclusive findings on diagnostic imaging of breast: Secondary | ICD-10-CM

## 2014-08-04 ENCOUNTER — Ambulatory Visit
Admission: RE | Admit: 2014-08-04 | Discharge: 2014-08-04 | Disposition: A | Discharge status: Home / Self Care | Payer: BLUE CROSS/BLUE SHIELD | Source: Ambulatory Visit | Attending: Obstetrics & Gynecology | Admitting: Obstetrics & Gynecology

## 2014-08-04 DIAGNOSIS — R928 Other abnormal and inconclusive findings on diagnostic imaging of breast: Secondary | ICD-10-CM

## 2016-03-08 ENCOUNTER — Ambulatory Visit: Payer: BLUE CROSS/BLUE SHIELD | Admitting: Physical Therapy

## 2016-03-13 ENCOUNTER — Encounter: Payer: Self-pay | Admitting: Physical Therapy

## 2016-03-13 ENCOUNTER — Ambulatory Visit (INDEPENDENT_AMBULATORY_CARE_PROVIDER_SITE_OTHER): Payer: 59 | Admitting: Physical Therapy

## 2016-03-13 DIAGNOSIS — M79631 Pain in right forearm: Secondary | ICD-10-CM

## 2016-03-13 DIAGNOSIS — M62838 Other muscle spasm: Secondary | ICD-10-CM

## 2016-03-13 DIAGNOSIS — M6281 Muscle weakness (generalized): Secondary | ICD-10-CM

## 2016-03-13 NOTE — Patient Instructions (Addendum)
Trigger Point Dry Needling  . What is Trigger Point Dry Needling (DN)? o DN is a physical therapy technique used to treat muscle pain and dysfunction. Specifically, DN helps deactivate muscle trigger points (muscle knots).  o A thin filiform needle is used to penetrate the skin and stimulate the underlying trigger point. The goal is for a local twitch response (LTR) to occur and for the trigger point to relax. No medication of any kind is injected during the procedure.   . What Does Trigger Point Dry Needling Feel Like?  o The procedure feels different for each individual patient. Some patients report that they do not actually feel the needle enter the skin and overall the process is not painful. Very mild bleeding may occur. However, many patients feel a deep cramping in the muscle in which the needle was inserted. This is the local twitch response.   Marland Kitchen. How Will I feel after the treatment? o Soreness is normal, and the onset of soreness may not occur for a few hours. Typically this soreness does not last longer than two days.  o Bruising is uncommon, however; ice can be used to decrease any possible bruising.  o In rare cases feeling tired or nauseous after the treatment is normal. In addition, your symptoms may get worse before they get better, this period will typically not last longer than 24 hours.   . What Can I do After My Treatment? o Increase your hydration by drinking more water for the next 24 hours. o You may place ice or heat on the areas treated that have become sore, however, do not use heat on inflamed or bruised areas. Heat often brings more relief post needling. o You can continue your regular activities, but vigorous activity is not recommended initially after the treatment for 24 hours. o DN is best combined with other physical therapy such as strengthening, stretching, and other therapies.  IONTOPHORESIS PATIENT PRECAUTIONS & CONTRAINDICATIONS:  . Redness under one or both  electrodes can occur.  This characterized by a uniform redness that usually disappears within 12 hours of treatment. . Small pinhead size blisters may result in response to the drug.  Contact your physician if the problem persists more than 24 hours. . On rare occasions, iontophoresis therapy can result in temporary skin reactions such as rash, inflammation, irritation or burns.  The skin reactions may be the result of individual sensitivity to the ionic solution used, the condition of the skin at the start of treatment, reaction to the materials in the electrodes, allergies or sensitivity to dexamethasone, or a poor connection between the patch and your skin.  Discontinue using iontophoresis if you have any of these reactions and report to your therapist. . Remove the Patch or electrodes if you have any undue sensation of pain or burning during the treatment and report discomfort to your therapist. . Tell your Therapist if you have had known adverse reactions to the application of electrical current. . Wear patch for 8 hrs . The Patch can be worn during normal activity, however excessive motion where the electrodes have been placed can cause poor contact between the skin and the electrode or uneven electrical current resulting in greater risk of skin irritation. Marland Kitchen. Keep out of the reach of children.   . DO NOT use if you have a cardiac pacemaker or any other electrically sensitive implanted device. . DO NOT use if you have a known sensitivity to dexamethasone. . DO NOT use during Magnetic Resonance  Imaging (MRI). . DO NOT use over broken or compromised skin (e.g. sunburn, cuts, or acne) due to the increased risk of skin reaction. . DO NOT SHAVE over the area to be treated:  To establish good contact between the Patch and the skin, excessive hair may be clipped. . DO NOT place the Patch or electrodes on or over your eyes, directly over your heart, or brain. . DO NOT reuse the Patch or electrodes  as this may cause burns to occur.    Perform cross friction massage to the tender areas, follow by the stretch below and end with ice massage.  4-5 times a day.   Wrist Extensor Stretch    Keeping elbow straight, grasp left hand and slowly bend wrist forward until stretch is felt. Hold __20-30__ seconds. Relax. Repeat __1__ times per set. Do __1__ sets per session. Do __4-5__ sessions per day.  Copyright  VHI. All rights reserved.

## 2016-03-13 NOTE — Therapy (Signed)
[facility] [place] [address] [place], [place], [address] Phone: [phone]   Fax:  [phone]  Physical Therapy Evaluation  Patient Details  Name: Sandra Mathews MRN: [id number] Date of Birth: [birth year] Referring Provider: Dr Rodolph Bong  Encounter Date: 03/13/2016      PT End of Session - 03/13/16 1516    Visit Number 1   Number of Visits 12   Date for PT Re-Evaluation 04/10/16   PT Start Time 1432   PT Stop Time 1516   PT Time Calculation (min) 44 min   Activity Tolerance Patient limited by pain      Past Medical History:  Diagnosis Date  . Migraines     Past Surgical History:  Procedure Laterality Date  . ABDOMINAL HYSTERECTOMY      There were no vitals filed for this visit.       Subjective Assessment - 03/13/16 1429    Subjective Pt reports she saw the MD back in November due to Rt arm pain, she developed pain about a month before that.  She has tried a strap on the forearm, a wrist/forearm brace, prednisone, an injection ( had relief for about 2-3 wks) in Dec and now the pain is as bad as it was.     Diagnostic tests none   Patient Stated Goals take the pain away.    Currently in Pain? Yes   Pain Score 1   5/10 with movement   Pain Location Arm  onthe top of the forearm   Pain Orientation Right   Pain Descriptors / Indicators Aching   Pain Type Acute pain   Pain Onset More than a month ago   Pain Frequency Constant   Aggravating Factors  use of the arm, lift anything, keeping her up at night.    Pain Relieving Factors nothing            [facility] PT Assessment - 03/13/16 0001      Assessment   Medical Diagnosis Rt lateral epicondylitis   Referring Provider Dr Rodolph Bong   Onset Date/Surgical Date 12/12/15   Hand Dominance Right   Next MD Visit 03/24/16   Prior Therapy none     Precautions   Precautions None   Required Braces or Orthoses --  arm brace during the day, and strap also      Balance Screen   Has the patient fallen in the past 6 months No     Prior Function   Level of Independence Independent  pain with clasping bra & lifting plates/oven stuff   Vocation Unemployed   Leisure read, shop      Observation/Other Assessments   Observations --   Skin Integrity 53% limited     Observation/Other Assessments-Edema    Edema --  (+) in Rt lateral forearm     Posture/Postural Control   Posture/Postural Control Postural limitations   Postural Limitations Rounded Shoulders;Forward head  slight     ROM / Strength   AROM / PROM / Strength AROM;Strength     AROM   AROM Assessment Site Cervical;Shoulder;Elbow;Forearm   Right/Left Shoulder --  bilat WNL   Right/Left Elbow --  bilat WNL, pain with wrist extension   Right/Left Forearm --  bilat WNL   Cervical Flexion 52   Cervical Extension 50   Cervical - Right Side Bend WNL   Cervical - Left Side Bend WNL   Cervical - Right Rotation WNL   Cervical - Left Rotation  WNL     Strength   Strength Assessment Site Shoulder;Elbow;Forearm;Wrist;Hand   Right/Left Shoulder --  bilat WNL   Right/Left Elbow --  Lt WNL, Rt triceps 5/5, biceps 4+/5 with pain   Right/Left Forearm --  supination 4/5 with pain, pronation WNL Rt    Right/Left Wrist --  Lt WNL, Rt ext 4-/5 pain , flexion WNL   Right/Left hand --  grip Rt 46#, Lt 52#     Palpation   Palpation comment multiple trigger points in Rt forearm, very tender to palpation, good mobiliity in the radius, ulna and metacarpals.                    [facility] Adult PT Treatment/Exercise - 03/13/16 0001      Exercises   Exercises --  Rt wrist flexion stretch for the forearm.      Modalities   Modalities Iontophoresis     Iontophoresis   Type of Iontophoresis Dexamethasone   Location Rt lateral elbow, extensor muslce origin   Dose 1.0cc   Time 80 mAp, 8 hr patch     Manual Therapy   Manual Therapy Soft tissue mobilization   Soft tissue  mobilization cross friction massage to Rt forearm, then ice massage                     PT Long Term Goals - 03/13/16 1717      PT LONG TERM GOAL #1   Title I with advanced HEP ( 04/10/16)    Time 4   Period Weeks   Status New     PT LONG TERM GOAL #2   Title demo Rt elbow/wrist/foream motion WNL and painfree ( 04/10/16)    Time 4   Period Weeks   Status New     PT LONG TERM GOAL #3   Title demo Rt wrist strength =/> 5-/5 and Rt wrist strength =/> 53# ( 04/10/16)    Time 4   Period Weeks   Status New     PT LONG TERM GOAL #4   Title report overall Rt forearm pain =/> 75% reduction ( 04/10/16)    Time 4   Period Weeks   Status New     PT LONG TERM GOAL #5   Title improve FOTO =/< 33% limited ( 04/10/16)    Time 4   Period Weeks   Status New               Plan - 03/13/16 1714    Clinical Impression Statement 61 yo female presents with ~ 3 month h/o, she has tried different braces/straps and had injections. These have failed.  She is now referred to PT.  She has multiple trigger points in her forearm with tenderness.  Her ROM is WNL, she has weakness in her hand and forearm.    Rehab Potential Excellent   PT Frequency 3x / week   PT Duration 4 weeks   PT Treatment/Interventions Ultrasound;Dry needling;Taping;Manual techniques;Vasopneumatic Device;Neuromuscular re-education;Cryotherapy;Electrical Stimulation;Patient/family education   PT Next Visit Plan continue ionto, DN if patient agreeable.    Consulted and Agree with Plan of Care Patient      Patient will benefit from skilled therapeutic intervention in order to improve the following deficits and impairments:  Impaired UE functional use, Increased muscle spasms, Pain, Decreased strength, Increased edema  Visit Diagnosis: Pain in right forearm - Plan: PT plan of care cert/re-cert  Muscle weakness (generalized) - Plan: PT plan of  care cert/re-cert  Other muscle spasm - Plan: PT plan of care  cert/re-cert     Problem List Patient Active Problem List   Diagnosis Date Noted  . Anxiety 04/18/2011  . Insomnia 04/18/2011  . LATERAL EPICONDYLITIS, RIGHT 05/06/2009  . HYPERLIPIDEMIA-MIXED 09/19/2008  . ANXIETY 09/19/2008  . MIGRAINE WITH AURA 09/19/2008    Roderic Scarce PT 03/13/2016, 5:26 PM  [facility] [address] [place], [place], [address] Phone: [phone]   Fax:  [phone]  Name: Sandra Mathews MRN: [id number] Date of Birth: [birth year]

## 2016-03-14 ENCOUNTER — Encounter: Payer: 59 | Admitting: Physical Therapy

## 2016-03-15 ENCOUNTER — Ambulatory Visit (INDEPENDENT_AMBULATORY_CARE_PROVIDER_SITE_OTHER): Payer: 59 | Admitting: Physical Therapy

## 2016-03-15 ENCOUNTER — Encounter: Payer: Self-pay | Admitting: Physical Therapy

## 2016-03-15 DIAGNOSIS — M79631 Pain in right forearm: Secondary | ICD-10-CM | POA: Diagnosis not present

## 2016-03-15 DIAGNOSIS — M6281 Muscle weakness (generalized): Secondary | ICD-10-CM

## 2016-03-15 DIAGNOSIS — M62838 Other muscle spasm: Secondary | ICD-10-CM | POA: Diagnosis not present

## 2016-03-15 NOTE — Therapy (Signed)
[place], [place], [address] Phone: [phone]   Fax:  [phone]  Physical Therapy Treatment  Patient Details  Name: Sandra Mathews MRN: [id number] Date of Birth: [birth year] Referring Provider: Dr Vickki Hearing  Encounter Date: 03/15/2016      PT End of Session - 03/15/16 0806    Visit Number 2   Number of Visits 12   Date for PT Re-Evaluation 04/10/16   PT Start Time 0805   PT Stop Time 0846   PT Time Calculation (min) 41 min   Activity Tolerance Patient tolerated treatment well      Past Medical History:  Diagnosis Date  . Migraines     Past Surgical History:  Procedure Laterality Date  . ABDOMINAL HYSTERECTOMY      There were no vitals filed for this visit.      Subjective Assessment - 03/15/16 0806    Subjective PT is doing her HEP feels like it may be a little better.     Currently in Pain? No/denies  at rest no pain, 2/10 with use.                          [place] Adult PT Treatment/Exercise - 03/15/16 0001      Exercises   Exercises --  wrist flexion stretch Rt      Modalities   Modalities Iontophoresis;Ultrasound     Ultrasound   Ultrasound Location Rt forearm   Ultrasound Parameters 100%, 1.76mz, 1.5w/cme   Ultrasound Goals Pain     Iontophoresis   Type of Iontophoresis Dexamethasone   Location Rt lateral elbow, extensor muslce origin   Dose 1.3cc   Time spider patch     Manual Therapy   Manual Therapy Soft tissue mobilization   Soft tissue mobilization cross friction massage to Rt forearm, then ice massage  IASTM to forearm          Trigger Point Dry Needling - 03/15/16 0809    Consent Given? Yes   Education Handout Provided Yes   Muscles Treated Upper Body --  Rt extensor carpi brevis                   PT Long Term Goals - 03/13/16 1717      PT LONG TERM GOAL #1   Title I with advanced HEP ( 04/10/16)    Time 4    Period Weeks   Status New     PT LONG TERM GOAL #2   Title demo Rt elbow/wrist/foream motion WNL and painfree ( 04/10/16)    Time 4   Period Weeks   Status New     PT LONG TERM GOAL #3   Title demo Rt wrist strength =/> 5-/5 and Rt wrist strength =/> 53# ( 04/10/16)    Time 4   Period Weeks   Status New     PT LONG TERM GOAL #4   Title report overall Rt forearm pain =/> 75% reduction ( 04/10/16)    Time 4   Period Weeks   Status New     PT LONG TERM GOAL #5   Title improve FOTO =/< 33% limited ( 04/10/16)    Time 4   Period Weeks   Status New               Plan - 03/15/16 0849    Clinical Impression Statement This is Tammy's second visit, no  goals met.  She tolerated ionto well and was able to tolerate more manual work with less tenderness. She had some pain with DN however we had a good release.    Rehab Potential Excellent   PT Frequency 3x / week   PT Duration 4 weeks   PT Treatment/Interventions Ultrasound;Dry needling;Taping;Manual techniques;Vasopneumatic Device;Neuromuscular re-education;Cryotherapy;Electrical Stimulation;Patient/family education   PT Next Visit Plan assess response to DN, cont manual work, [place] and ionto.  NExt week add in eccentric work.    Consulted and Agree with Plan of Care Patient      Patient will benefit from skilled therapeutic intervention in order to improve the following deficits and impairments:  Impaired UE functional use, Increased muscle spasms, Pain, Decreased strength, Increased edema  Visit Diagnosis: Pain in right forearm  Muscle weakness (generalized)  Other muscle spasm     Problem List Patient Active Problem List   Diagnosis Date Noted  . Anxiety 04/18/2011  . Insomnia 04/18/2011  . LATERAL EPICONDYLITIS, RIGHT 05/06/2009  . HYPERLIPIDEMIA-MIXED 09/19/2008  . ANXIETY 09/19/2008  . MIGRAINE WITH AURA 09/19/2008    Jeral Pinch PT  03/15/2016, 8:52 AM  [facility] [place], [place], [address] Phone: [phone]   Fax:  [phone]  Name: AMBERLY LIVAS MRN: [id number] Date of Birth: [birth year]

## 2016-03-17 ENCOUNTER — Encounter: Payer: Self-pay | Admitting: Rehabilitative and Restorative Service Providers"

## 2016-03-17 ENCOUNTER — Ambulatory Visit (INDEPENDENT_AMBULATORY_CARE_PROVIDER_SITE_OTHER): Payer: 59 | Admitting: Rehabilitative and Restorative Service Providers"

## 2016-03-17 DIAGNOSIS — M6281 Muscle weakness (generalized): Secondary | ICD-10-CM | POA: Diagnosis not present

## 2016-03-17 DIAGNOSIS — M79631 Pain in right forearm: Secondary | ICD-10-CM

## 2016-03-17 DIAGNOSIS — M62838 Other muscle spasm: Secondary | ICD-10-CM

## 2016-03-17 NOTE — Therapy (Signed)
[facility] [place] [address] [place], [place], [address] Phone: [phone]   Fax:  [phone]  Physical Therapy Treatment  Patient Details  Name: Sandra Mathews MRN: [id number] Date of Birth: April 09, 1955 Referring Provider: Dr Rodolph Bong  Encounter Date: 03/17/2016      PT End of Session - 03/17/16 1406    Visit Number 3   Number of Visits 12   Date for PT Re-Evaluation 04/10/16   PT Start Time 1402   PT Stop Time 1445   PT Time Calculation (min) 43 min   Activity Tolerance Patient tolerated treatment well      Past Medical History:  Diagnosis Date  . Migraines     Past Surgical History:  Procedure Laterality Date  . ABDOMINAL HYSTERECTOMY      There were no vitals filed for this visit.      Subjective Assessment - 03/17/16 1445    Subjective Thinks therapy is helping. Good day yesterday. Hurting some again today. Overall improving. Better following PT. May want to try DN again but not today.    Currently in Pain? Yes   Pain Score 2    Pain Location Arm   Pain Orientation Right   Pain Descriptors / Indicators Aching;Tightness   Pain Type Chronic pain   Pain Onset More than a month ago   Pain Frequency Constant                         [facility] Adult PT Treatment/Exercise - 03/17/16 0001      Exercises   Exercises --     Elbow Exercises   Other elbow exercises extensor forearm stretch light fist/slight turn to ulnar border 30 sec x 3    Other elbow exercises median nerve stretch supine w/head tipped and turned to Lt 60 sec x 2      Modalities   Modalities Iontophoresis;Ultrasound     Ultrasound   Ultrasound Location Rt forearm; Rt lateral epicondyle    Ultrasound Parameters 100%/20%; 1 mHz; 4 min forearm/4 min lateral epicondyle    Ultrasound Goals Pain;Other (Comment)     Iontophoresis   Type of Iontophoresis Dexamethasone   Location Rt lateral elbow, extensor muslce origin   Dose 1.3cc   Time 40 mAmp; 8-12 hours      Manual Therapy   Manual Therapy Soft tissue mobilization   Soft tissue mobilization IASTM Rt forearm; deep tissue work through forearm as tolerated; transferse frictioin distal to lateral epicondyle                PT Education - 03/17/16 1436    Education provided Yes   Education Details HEP   Person(s) Educated Patient   Methods Explanation;Demonstration;Tactile cues;Verbal cues;Handout   Comprehension Verbalized understanding;Returned demonstration;Verbal cues required;Tactile cues required             PT Long Term Goals - 03/17/16 1448      PT LONG TERM GOAL #1   Title I with advanced HEP ( 04/10/16)    Time 4   Period Weeks   Status On-going     PT LONG TERM GOAL #2   Title demo Rt elbow/wrist/foream motion WNL and painfree ( 04/10/16)    Time 4   Period Weeks   Status On-going     PT LONG TERM GOAL #3   Title demo Rt wrist strength =/> 5-/5 and Rt wrist strength =/> 53# ( 04/10/16)  Time 4   Period Weeks   Status On-going     PT LONG TERM GOAL #4   Title report overall Rt forearm pain =/> 75% reduction ( 04/10/16)    Time 4   Period Weeks   Status On-going     PT LONG TERM GOAL #5   Title improve FOTO =/< 33% limited ( 04/10/16)    Time 4   Period Weeks   Status On-going               Plan - 03/17/16 1446    Clinical Impression Statement Progressing with improved pain management; less palpable tightness; improved tolerance to stretching/manual work.    Rehab Potential Excellent   PT Frequency 3x / week   PT Duration 4 weeks   PT Treatment/Interventions Ultrasound;Dry needling;Taping;Manual techniques;Vasopneumatic Device;Neuromuscular re-education;Cryotherapy;Electrical Stimulation;Patient/family education   PT Next Visit Plan continue DN as indicated, cont manual work, US and ionto.  NExt week add in eccentric work.    Consulted and Agree with Plan of Care Patient      Patient will benefit  from skilled therapeutic intervention in order to improve the following deficits and impairments:  Impaired UE functional use, Increased muscle spasms, Pain, Decreased strength, Increased edema  Visit Diagnosis: Pain in right forearm  Muscle weakness (generalized)  Other muscle spasm     Problem List Patient Active Problem List   Diagnosis Date Noted  . Anxiety 04/18/2011  . Insomnia 04/18/2011  . LATERAL EPICONDYLITIS, RIGHT 05/06/2009  . HYPERLIPIDEMIA-MIXED 09/19/2008  . ANXIETY 09/19/2008  . MIGRAINE WITH AURA 09/19/2008    Sapna Padron Rober MinionP Chey Rachels PT, MPH 03/17/2016, 2:52 PM  [facility] [address] [place], [place], [address] Phone: [phone]   Fax:  [phone]  Name: Sandra Mathews MRN: [id number] Date of Birth: 04/02/1955

## 2016-03-17 NOTE — Patient Instructions (Signed)
Neurovascular: Median Nerve Stretch - Supine    Lie with neck supported, side-bent away from moving arm. Hold right arm out to side, elbow bent, thumb down, fingers and wrist bent back. Slowly straighten elbowas far as possible without pain. Hold for __60__ seconds. Repeat __2__ times per set. Do _2 times/day

## 2016-03-20 ENCOUNTER — Encounter: Payer: Self-pay | Admitting: Rehabilitative and Restorative Service Providers"

## 2016-03-20 ENCOUNTER — Ambulatory Visit (INDEPENDENT_AMBULATORY_CARE_PROVIDER_SITE_OTHER): Payer: 59 | Admitting: Rehabilitative and Restorative Service Providers"

## 2016-03-20 DIAGNOSIS — M6281 Muscle weakness (generalized): Secondary | ICD-10-CM

## 2016-03-20 DIAGNOSIS — M79631 Pain in right forearm: Secondary | ICD-10-CM

## 2016-03-20 DIAGNOSIS — M62838 Other muscle spasm: Secondary | ICD-10-CM

## 2016-03-20 NOTE — Therapy (Signed)
[facility] [address] [place], [place], [address] Phone: [phone]   Fax:  [phone]  Physical Therapy Treatment  Patient Details  Name: Sandra Mathews MRN: [id number] Date of Birth: 02/03/1956 Referring Provider: Dr Rodolph BongAdam Kendall  Encounter Date: 03/20/2016      PT End of Session - 03/20/16 1440    Visit Number 4   Number of Visits 12   Date for PT Re-Evaluation 04/10/16   PT Start Time 1404   PT Stop Time 1436   PT Time Calculation (min) 32 min   Activity Tolerance Patient tolerated treatment well      Past Medical History:  Diagnosis Date  . Migraines     Past Surgical History:  Procedure Laterality Date  . ABDOMINAL HYSTERECTOMY      There were no vitals filed for this visit.      Subjective Assessment - 03/20/16 1436    Subjective Out of town this weekend - did not get to do her exercises like she would like. A little stiff feeling today.    Currently in Pain? Yes   Pain Score 1    Pain Location Arm   Pain Descriptors / Indicators Aching;Tightness   Pain Type Chronic pain   Pain Onset More than a month ago   Pain Frequency Constant                         [facility] Adult PT Treatment/Exercise - 03/20/16 0001      Elbow Exercises   Wrist Extension --  trial of wrist extension with 1# wt - painful    Other elbow exercises extensor forearm stretch light fist/slight turn to ulnar border 30 sec x 3; triceps stretch 30 sec x 3    Other elbow exercises median nerve stretch supine w/head tipped and turned to Lt 60 sec x 2      Modalities   Modalities Iontophoresis;Ultrasound     Ultrasound   Ultrasound Location Rt forearm; extensor tendon area at lateral epicondyle    Ultrasound Parameters 100%/20%; 1 mHz; 1.1 w/cm2/.8 w/cm2 10 min total    Ultrasound Goals Pain;Other (Comment)     Iontophoresis   Type of Iontophoresis Dexamethasone   Location Rt lateral elbow, extensor  muslce origin   Dose 1.3cc   Time 40 mAmp; 8-12 hours      Manual Therapy   Manual Therapy Soft tissue mobilization   Soft tissue mobilization IASTM Rt forearm; deep tissue work through forearm as tolerated; transferse frictioin distal to lateral epicondyle                PT Education - 03/20/16 1430    Education provided Yes   Education Details HEP   Person(s) Educated Patient   Methods Explanation;Demonstration;Tactile cues;Verbal cues;Handout   Comprehension Verbalized understanding;Returned demonstration;Verbal cues required;Tactile cues required             PT Long Term Goals - 03/17/16 1448      PT LONG TERM GOAL #1   Title I with advanced HEP ( 04/10/16)    Time 4   Period Weeks   Status On-going     PT LONG TERM GOAL #2   Title demo Rt elbow/wrist/foream motion WNL and painfree ( 04/10/16)    Time 4   Period Weeks   Status On-going     PT LONG TERM GOAL #3   Title demo Rt wrist strength =/> 5-/5 and Rt  wrist strength =/> 53# ( 04/10/16)    Time 4   Period Weeks   Status On-going     PT LONG TERM GOAL #4   Title report overall Rt forearm pain =/> 75% reduction ( 04/10/16)    Time 4   Period Weeks   Status On-going     PT LONG TERM GOAL #5   Title improve FOTO =/< 33% limited ( 04/10/16)    Time 4   Period Weeks   Status On-going               Plan - 03/20/16 1440    Clinical Impression Statement Some increase in tightness and pain today. Patient unable to work on exercises over the weekend. Trial of resistive eccentric exercise created pain in the extensor forearm. Note less tightness with palpation. Will attempt eccentrics at next session.    Rehab Potential Excellent   PT Frequency 3x / week   PT Duration 4 weeks   PT Treatment/Interventions Ultrasound;Dry needling;Taping;Manual techniques;Vasopneumatic Device;Neuromuscular re-education;Cryotherapy;Electrical Stimulation;Patient/family education   PT Next Visit Plan continue DN as  indicated, cont manual work, [place] and ionto.  NExt week add in eccentric work as tolerated   Consulted and Agree with Plan of Care Patient      Patient will benefit from skilled therapeutic intervention in order to improve the following deficits and impairments:  Impaired UE functional use, Increased muscle spasms, Pain, Decreased strength, Increased edema  Visit Diagnosis: Pain in right forearm  Muscle weakness (generalized)  Other muscle spasm     Problem List Patient Active Problem List   Diagnosis Date Noted  . Anxiety 04/18/2011  . Insomnia 04/18/2011  . LATERAL EPICONDYLITIS, RIGHT 05/06/2009  . HYPERLIPIDEMIA-MIXED 09/19/2008  . ANXIETY 09/19/2008  . MIGRAINE WITH AURA 09/19/2008    Valaria Kohut Rober Minion PT, MPH  03/20/2016, 2:47 PM  [facility] [address] [place], [place], [address] Phone: [phone]   Fax:  [phone]  Name: Sandra Mathews MRN: [id number] Date of Birth: 08-02-1955

## 2016-03-20 NOTE — Patient Instructions (Signed)
Triceps    Sit with feet flat, head centered. Bring right arm behind head. Use other hand to pull elbow further behind head. Keep back straight, head up. Hold _30___ seconds. Repeat _3___ times. Do _2-3__ sessions per day. CAUTION: Stretch slowly and gently.  .Marland Kitchen

## 2016-03-22 ENCOUNTER — Encounter: Payer: Self-pay | Admitting: Physical Therapy

## 2016-03-22 ENCOUNTER — Ambulatory Visit (INDEPENDENT_AMBULATORY_CARE_PROVIDER_SITE_OTHER): Payer: 59 | Admitting: Physical Therapy

## 2016-03-22 DIAGNOSIS — M62838 Other muscle spasm: Secondary | ICD-10-CM | POA: Diagnosis not present

## 2016-03-22 DIAGNOSIS — M6281 Muscle weakness (generalized): Secondary | ICD-10-CM | POA: Diagnosis not present

## 2016-03-22 DIAGNOSIS — M79631 Pain in right forearm: Secondary | ICD-10-CM

## 2016-03-22 NOTE — Therapy (Signed)
[facility] [place] [address] [place], [place], [address] Phone: [phone]   Fax:  [phone]  Physical Therapy Treatment  Patient Details  Name: ATARA PATERSON MRN: [id number] Date of Birth: [birth year] Referring Provider: Dr Rodolph Bong  Encounter Date: 03/22/2016      PT End of Session - 03/22/16 1431    Visit Number 5   Number of Visits 12   Date for PT Re-Evaluation 04/10/16   PT Start Time 1431   PT Stop Time 1519   PT Time Calculation (min) 48 min   Activity Tolerance Patient tolerated treatment well      Past Medical History:  Diagnosis Date  . Migraines     Past Surgical History:  Procedure Laterality Date  . ABDOMINAL HYSTERECTOMY      There were no vitals filed for this visit.      Subjective Assessment - 03/22/16 1436    Subjective Tammy reports she spent a lot with ice last night and her arm feels better today   Patient Stated Goals take the pain away.    Currently in Pain? Yes   Pain Score 1                          [facility] Adult PT Treatment/Exercise - 03/22/16 0001      Elbow Exercises   Wrist Extension Strengthening;Right;20 reps  focus on eccentric    Bar Weights/Barbell (Wrist Extension) 1 lb     Modalities   Modalities Iontophoresis;Ultrasound     Ultrasound   Ultrasound Location Rt forearm, extension origin area   Ultrasound Parameters 100%/20%, 1.37mHz, 1.1/0.8 5'/4' each area   Ultrasound Goals Pain;Other (Comment)     Iontophoresis   Type of Iontophoresis Dexamethasone   Location Rt lateral elbow, extensor muslce origin   Dose 1.0cc   Time , patch     Manual Therapy   Manual Therapy Soft tissue mobilization   Soft tissue mobilization IASTM Rt forearm; deep tissue work through forearm as tolerated; transferse frictioin distal to lateral epicondyle                     PT Long Term Goals - 03/17/16 1448      PT LONG TERM GOAL #1    Title I with advanced HEP ( 04/10/16)    Time 4   Period Weeks   Status On-going     PT LONG TERM GOAL #2   Title demo Rt elbow/wrist/foream motion WNL and painfree ( 04/10/16)    Time 4   Period Weeks   Status On-going     PT LONG TERM GOAL #3   Title demo Rt wrist strength =/> 5-/5 and Rt wrist strength =/> 53# ( 04/10/16)    Time 4   Period Weeks   Status On-going     PT LONG TERM GOAL #4   Title report overall Rt forearm pain =/> 75% reduction ( 04/10/16)    Time 4   Period Weeks   Status On-going     PT LONG TERM GOAL #5   Title improve FOTO =/< 33% limited ( 04/10/16)    Time 4   Period Weeks   Status On-going               Plan - 03/22/16 1524    Clinical Impression Statement Tammy reports she feels like therapy is really helping her. She still has some pain in  her forearm however it is much less.  Was able to tolerate eccentric exercise better today.  See's MD on Friday.    Rehab Potential Excellent   PT Frequency 3x / week   PT Duration 4 weeks   PT Treatment/Interventions Ultrasound;Dry needling;Taping;Manual techniques;Vasopneumatic Device;Neuromuscular re-education;Cryotherapy;Electrical Stimulation;Patient/family education   PT Next Visit Plan continue DN as indicated, cont manual work, US and ionto, send note to MD   [employer], [employer] and CompanyConsulted and Agree with Plan of Care Patient      Patient will benefit from skilled therapeutic intervention in order to improve the following deficits and impairments:  Impaired UE functional use, Increased muscle spasms, Pain, Decreased strength, Increased edema  Visit Diagnosis: Pain in right forearm  Muscle weakness (generalized)  Other muscle spasm     Problem List Patient Active Problem List   Diagnosis Date Noted  . Anxiety 04/18/2011  . Insomnia 04/18/2011  . LATERAL EPICONDYLITIS, RIGHT 05/06/2009  . HYPERLIPIDEMIA-MIXED 09/19/2008  . ANXIETY 09/19/2008  . MIGRAINE WITH AURA 09/19/2008    Roderic ScarceSusan Bo Teicher PT   03/22/2016, 3:27 PM  [facility] [address] [place], [place], [address] Phone: [phone]   Fax:  [phone]  Name: Zachery Conchamela P Chavis MRN: [id number] Date of Birth: 08/30/1955

## 2016-03-23 ENCOUNTER — Ambulatory Visit (INDEPENDENT_AMBULATORY_CARE_PROVIDER_SITE_OTHER): Payer: 59 | Admitting: Physical Therapy

## 2016-03-23 ENCOUNTER — Encounter: Payer: Self-pay | Admitting: Physical Therapy

## 2016-03-23 DIAGNOSIS — M62838 Other muscle spasm: Secondary | ICD-10-CM | POA: Diagnosis not present

## 2016-03-23 DIAGNOSIS — M79631 Pain in right forearm: Secondary | ICD-10-CM | POA: Diagnosis not present

## 2016-03-23 DIAGNOSIS — M6281 Muscle weakness (generalized): Secondary | ICD-10-CM

## 2016-03-23 NOTE — Patient Instructions (Addendum)
Wrist Extension: Resisted    With right palm down, __1-3__ pound weight in hand, bend wrist up. Return slowly. Repeat _10-15___ times per set. Do _2-3___ sets per session. Do __1__ sessions per day.   Forearm Pronation / Supination: Resisted (Sitting) - arm supported on table    With right forearm supported, grasp object and gently rotate palm up, then down, as far as possible without pain. Repeat __10-15__ times per set. Do _2-3___ sets per session. Do __1__ sessions per day.  Finger Extension / Thumb Abduction: Resisted    With rubber band around right thumb and _____all___ fingers, hand slightly cupped, gently spread thumb and fingers apart fast, slowly close fingers. Repeat _10___ times per set. Do __2-3__ sets per session. Do __1__ sessions per day.  Copyright  VHI. All rights reserved.

## 2016-03-23 NOTE — Therapy (Signed)
[place], [place], [address] Phone: [phone]   Fax:  [phone]  Physical Therapy Treatment  Patient Details  Name: Sandra Mathews MRN: [id number] Date of Birth: Jan 16, 1956 Referring Provider: Dr Vickki Hearing  Encounter Date: 03/23/2016      PT End of Session - 03/23/16 1452    Visit Number 6   Number of Visits 12   Date for PT Re-Evaluation 04/10/16   PT Start Time 1287   PT Stop Time 1542   PT Time Calculation (min) 50 min      Past Medical History:  Diagnosis Date  . Migraines     Past Surgical History:  Procedure Laterality Date  . ABDOMINAL HYSTERECTOMY      There were no vitals filed for this visit.      Subjective Assessment - 03/23/16 1458    Subjective Sandra Mathews reports her arm is even better today than yesterday.    Patient Stated Goals take the pain away.    Currently in Pain? No/denies                         [facility] Adult PT Treatment/Exercise - 03/23/16 0001      Elbow Exercises   Forearm Supination Strengthening;Right;10 reps  2 sets, holding weighted bar rotating full ROM   Wrist Extension Strengthening;Right  2x15 focus on eccentric   Bar Weights/Barbell (Wrist Extension) 1 lb   Other elbow exercises finger ext with rubber band     Modalities   Modalities Iontophoresis;Ultrasound     Ultrasound   Ultrasound Location Rt forearm/Rt extensor muscle origin group   Ultrasound Parameters 100%/20%, 1.5/0.8 w/cm2, 1.0/3.38mz   Ultrasound Goals Pain;Edema     Iontophoresis   Type of Iontophoresis Dexamethasone   Location Rt lateral elbow, extensor muslce origin   Dose 1.0cc   Time 127m, patch     Manual Therapy   Manual Therapy Soft tissue mobilization   Soft tissue mobilization IASTM Rt forearm; deep tissue work through forearm as tolerated; transferse frictioin distal to lateral epicondyle  ice massage                      PT Long Term Goals - 03/23/16 1746      PT LONG TERM GOAL #1   Title I with advanced HEP ( 04/10/16)    Status On-going     PT LONG TERM GOAL #2   Title demo Rt elbow/wrist/foream motion WNL and painfree ( 04/10/16)    Status On-going  progressing to goals, pain is now intermittent     PT LONG TERM GOAL #3   Title demo Rt wrist strength =/> 5-/5 and Rt wrist strength =/> 53# ( 04/10/16)    Status On-going     PT LONG TERM GOAL #4   Title report overall Rt forearm pain =/> 75% reduction ( 04/10/16)    Status Achieved     PT LONG TERM GOAL #5   Title improve FOTO =/< 33% limited ( 04/10/16)    Status On-going               Plan - 03/23/16 1747    Clinical Impression Statement Sandra Mathews is making progress, she has less pain and is able to perform more tasks than when she first came for therapy.  Met one goal and progressing to the others.  She has begun eccentric strengthening this week and it tolerating  it.  Still has some weakness and scar tissue/restrictions in the extensors of the Rt forearm.    Rehab Potential Excellent   PT Frequency 3x / week   PT Duration 4 weeks   PT Treatment/Interventions Ultrasound;Dry needling;Taping;Manual techniques;Vasopneumatic Device;Neuromuscular re-education;Cryotherapy;Electrical Stimulation;Patient/family education   PT Next Visit Plan wean from ionto, continue with [place] and other.   Consulted and Agree with Plan of Care Patient      Patient will benefit from skilled therapeutic intervention in order to improve the following deficits and impairments:  Impaired UE functional use, Increased muscle spasms, Pain, Decreased strength, Increased edema  Visit Diagnosis: Pain in right forearm  Muscle weakness (generalized)  Other muscle spasm     Problem List Patient Active Problem List   Diagnosis Date Noted  . Anxiety 04/18/2011  . Insomnia 04/18/2011  . LATERAL EPICONDYLITIS, RIGHT 05/06/2009  . HYPERLIPIDEMIA-MIXED 09/19/2008  .  ANXIETY 09/19/2008  . MIGRAINE WITH AURA 09/19/2008    Jeral Pinch PT  03/23/2016, 5:50 PM  [facility] [place], [place], [address] Phone: [phone]   Fax:  [phone]  Name: Sandra Mathews MRN: [id number] Date of Birth: 1955-03-07

## 2016-03-24 ENCOUNTER — Encounter: Payer: 59 | Admitting: Physical Therapy

## 2016-03-28 ENCOUNTER — Encounter: Payer: 59 | Admitting: Physical Therapy

## 2016-03-30 ENCOUNTER — Ambulatory Visit (INDEPENDENT_AMBULATORY_CARE_PROVIDER_SITE_OTHER): Payer: 59 | Admitting: Physical Therapy

## 2016-03-30 ENCOUNTER — Encounter: Payer: Self-pay | Admitting: Physical Therapy

## 2016-03-30 DIAGNOSIS — M79631 Pain in right forearm: Secondary | ICD-10-CM

## 2016-03-30 DIAGNOSIS — M6281 Muscle weakness (generalized): Secondary | ICD-10-CM

## 2016-03-30 DIAGNOSIS — M62838 Other muscle spasm: Secondary | ICD-10-CM

## 2016-03-30 NOTE — Therapy (Signed)
[facility] [address] [place], [place], [address] Phone: [phone]   Fax:  [phone]  Physical Therapy Treatment  Patient Details  Name: Ashly P Litaker MRN: [id number] Date of Birth: 03/04/1955 Referring Provider: Dr Kendall  Encounter Date: 03/30/2016      PT End of Session - 03/30/16 0942    Visit Number 7   Date for PT Re-Evaluation 04/10/16   PT Start Time 0938  back late   PT Stop Time 1013   PT Time Calculation (min) 35 min      Past Medical History:  Diagnosis Date  . Migraines     Past Surgical History:  Procedure Laterality Date  . ABDOMINAL HYSTERECTOMY      There were no vitals filed for this visit.          [facility] PT Assessment - 03/30/16 0001      Assessment   Medical Diagnosis Rt lateral epicondylitis   Referring Provider Dr Kendall   Onset Date/Surgical Date 12/12/15     Observation/Other Assessments   Skin Integrity 40% limited     Strength   Right/Left hand --  Rt 57#, no pain                     [facility] Adult PT Treatment/Exercise - 03/30/16 0001      Elbow Exercises   Other elbow exercises UBE L1x 4' alt FWD/BWD  finger ext, med resistance ball/bands     Modalities   Modalities Iontophoresis;Ultrasound     Ultrasound   Ultrasound Location Rt forearm extensor origin area   Ultrasound Parameters 50%, 1.0Mhz, 1.0 w/cm2   Ultrasound Goals Pain;Edema     Iontophoresis   Type of Iontophoresis Dexamethasone   Location Rt lateral elbow, extensor muslce origin   Dose 1.0cc   Time 120mAp, patch     Manual Therapy   Manual Therapy Soft tissue mobilization   Soft tissue mobilization cross friction massage Rt forearm followed by ice massage                     PT Long Term Goals - 03/30/16 0943      PT LONG TERM GOAL #1   Title I with advanced HEP ( 04/10/16)    Status Achieved     PT LONG TERM GOAL #2   Title demo Rt elbow/wrist/foream  motion WNL and painfree ( 04/10/16)    Status Achieved     PT LONG TERM GOAL #3   Title demo Rt wrist strength =/> 5-/5 and Rt wrist strength =/> 53# ( 04/10/16)    Status Achieved     PT LONG TERM GOAL #4   Title report overall Rt forearm pain =/> 75% reduction ( 04/10/16)    Status Achieved     PT LONG TERM GOAL #5   Title improve FOTO =/< 33% limited ( 04/10/16)    Status Not Met  40% limited               Plan - 03/30/16 1127    Clinical Impression Statement Tammy continues to do well, she saw her MD earlier this week and he is pleased as well.  She is ready to discharge to HEP.  All goals met except her FOTO goal, she did make 13% improvement in this.     PT Next Visit Plan discharge to HEP    Consulted and Agree with Plan of Care Patient        Patient will benefit from skilled therapeutic intervention in order to improve the following deficits and impairments:     Visit Diagnosis: Pain in right forearm  Muscle weakness (generalized)  Other muscle spasm     Problem List Patient Active Problem List   Diagnosis Date Noted  . Anxiety 04/18/2011  . Insomnia 04/18/2011  . LATERAL EPICONDYLITIS, RIGHT 05/06/2009  . HYPERLIPIDEMIA-MIXED 09/19/2008  . ANXIETY 09/19/2008  . MIGRAINE WITH AURA 09/19/2008    Jeral Pinch PT  03/30/2016, 11:31 AM  [facility] [place], [place], [address] Phone: [phone]   Fax:  [phone]  Name: AVERLEE SWARTZ MRN: [id number] Date of Birth: 1955/12/16  PHYSICAL THERAPY DISCHARGE SUMMARY  Visits from Start of Care: 7  Current functional level related to goals / functional outcomes: See above , full UE ROM and strength WNL   Remaining deficits: Minimal pain when high demand placed on Rt forearm   Education / Equipment: HEP  Plan: Patient agrees to discharge.  Patient goals were partially met. Patient is being discharged due to being pleased with  the current functional level.  ?????   Jeral Pinch, PT 03/30/16 11:32 AM

## 2016-08-06 IMAGING — CR DG CHEST 2V
2 series · 2 of 2 positions shown · non-contrast
Comparison: None currently available

CLINICAL DATA: Shortness of breath

EXAM:
CHEST  2 VIEW

[w chest pa]
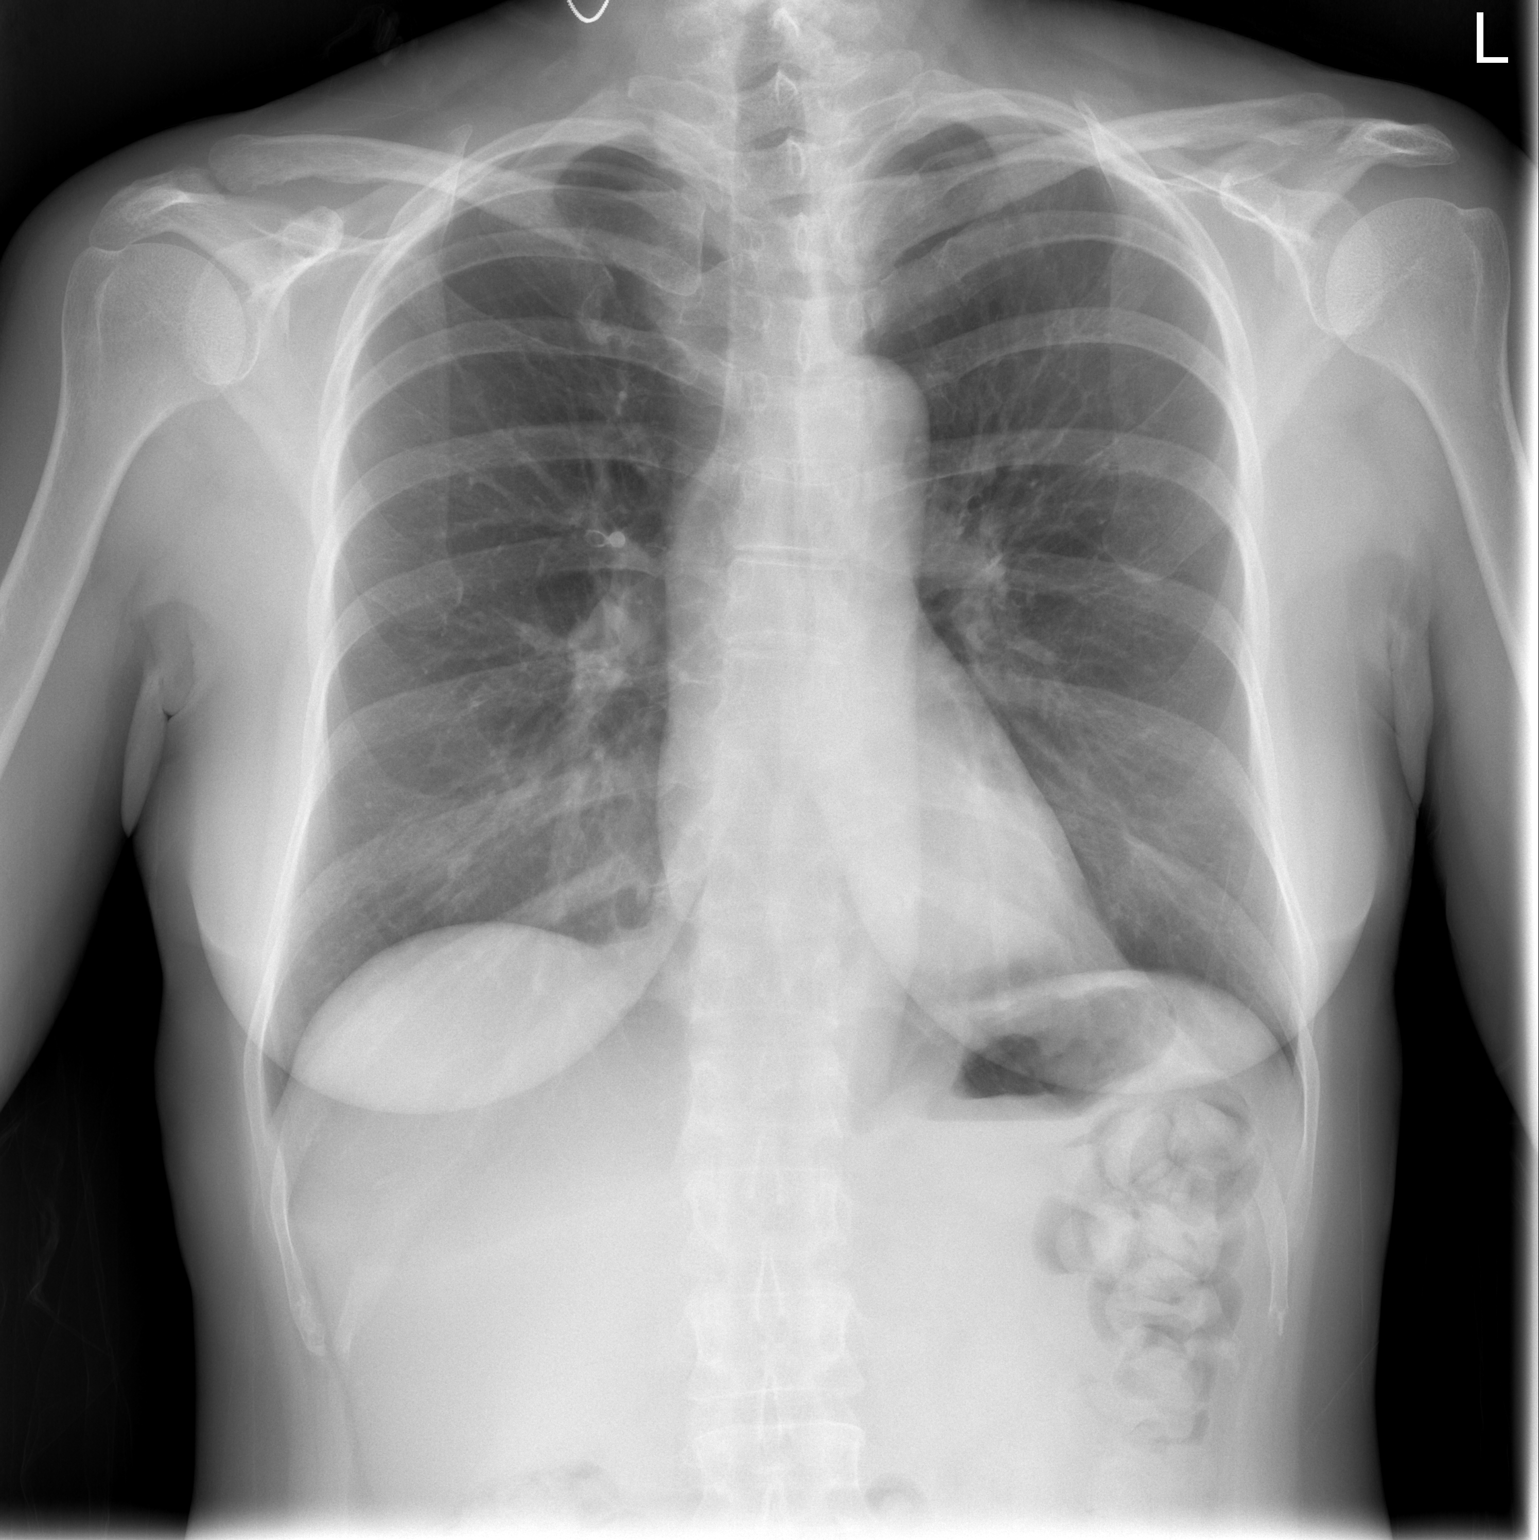

[w chest lat]
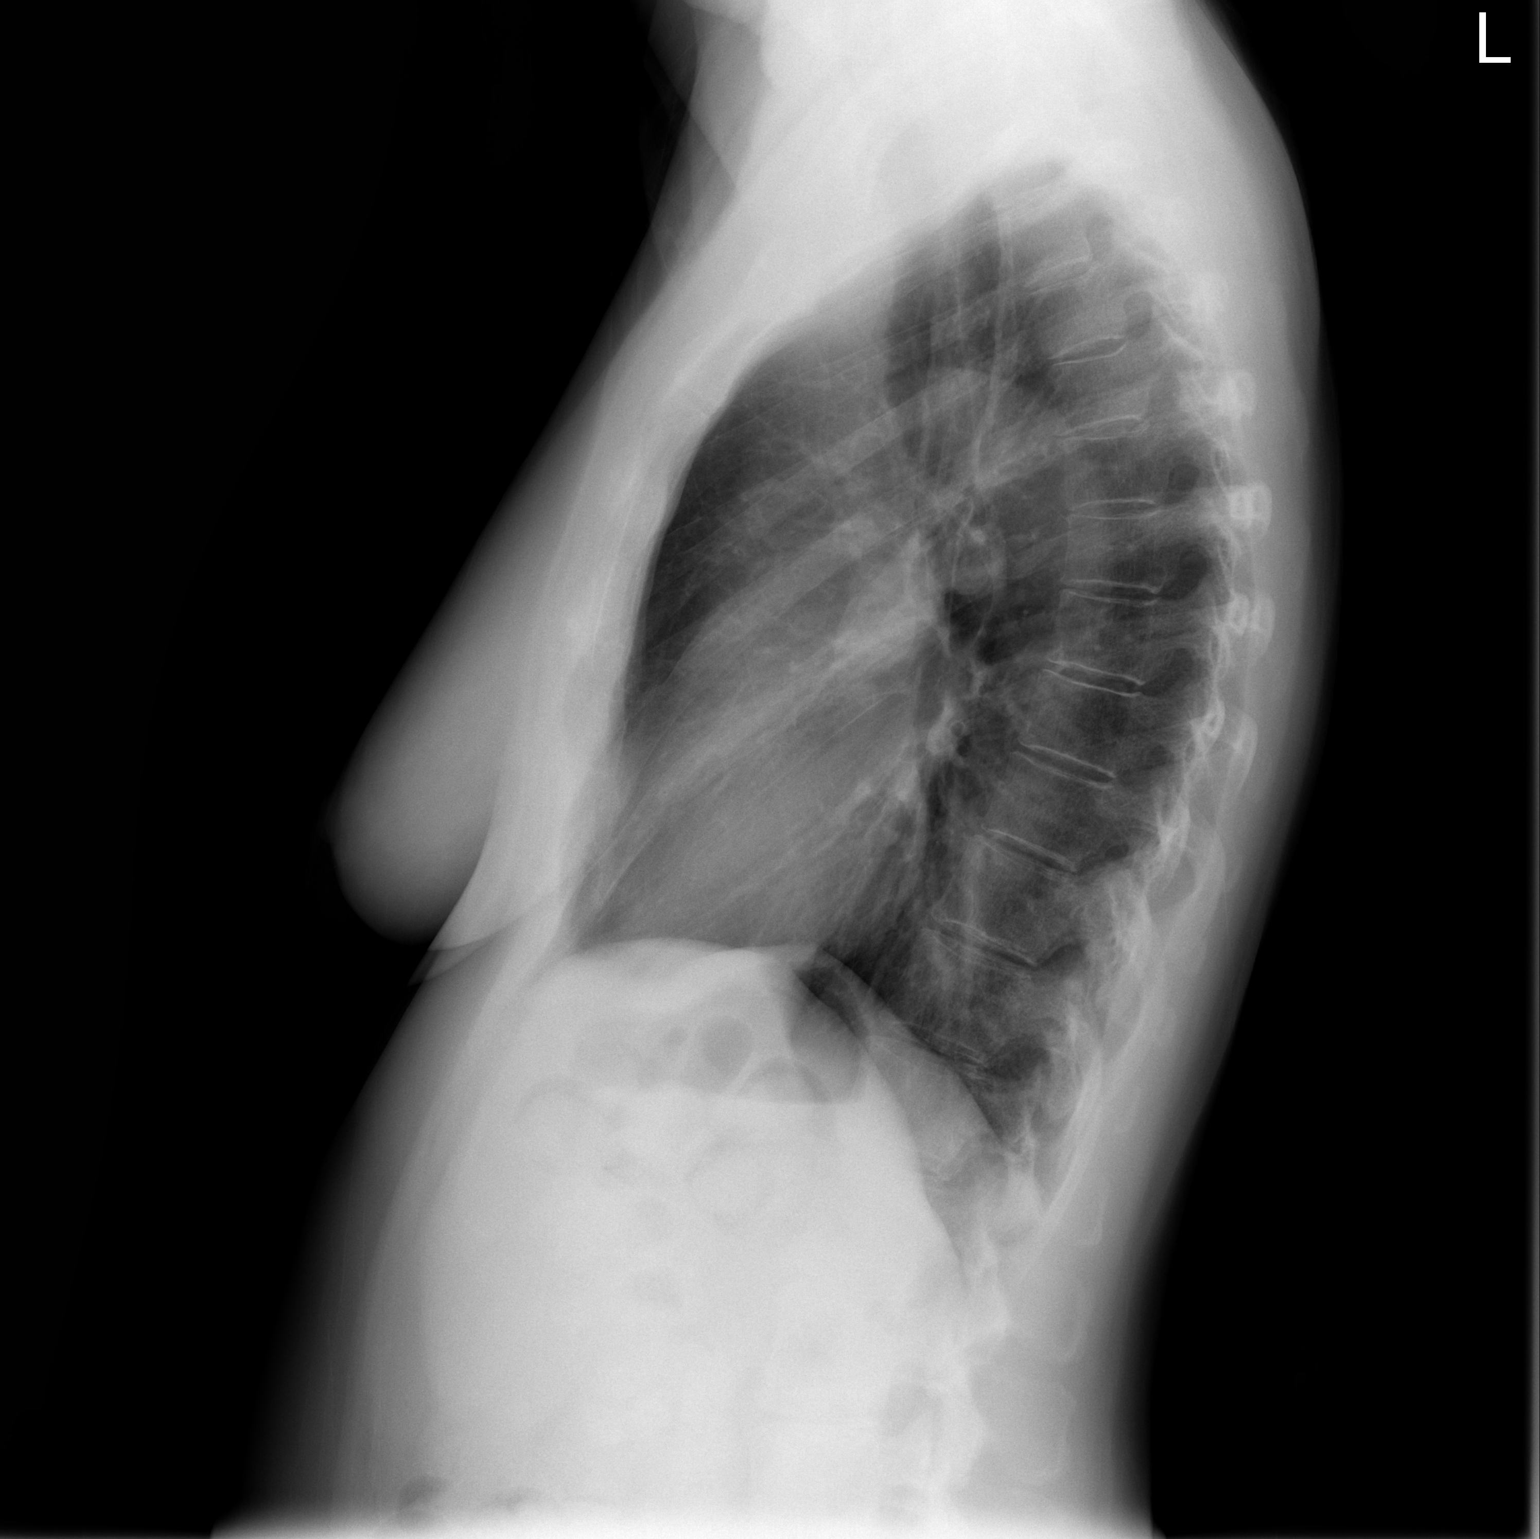

[2 of 2 positions shown; findings below may reference images not displayed]

FINDINGS: Normal heart size and mediastinal contours. No acute infiltrate or
edema. No effusion or pneumothorax. No acute osseous findings.
IMPRESSION: Negative chest.

## 2021-06-16 ENCOUNTER — Ambulatory Visit (INDEPENDENT_AMBULATORY_CARE_PROVIDER_SITE_OTHER): Payer: Self-pay | Admitting: Medical-Surgical

## 2021-06-16 DIAGNOSIS — Z91199 Patient's noncompliance with other medical treatment and regimen due to unspecified reason: Secondary | ICD-10-CM

## 2021-06-16 NOTE — Progress Notes (Signed)
   Complete physical exam  Patient: Sandra Mathews   DOB: 12/10/1997   66 y.o. Female  MRN: [id number]  Subjective:    No chief complaint on file.   Sandra Mathews is a 66 y.o. female who presents today for a complete physical exam. She reports consuming a {diet types:17450} diet. {types:19826} She generally feels {DESC; WELL/FAIRLY WELL/POORLY:18703}. She reports sleeping {DESC; WELL/FAIRLY WELL/POORLY:18703}. She {does/does not:200015} have additional problems to discuss today.    Most recent fall risk assessment:    08/17/2021   10:42 AM  Fall Risk   Falls in the past year? 0  Number falls in past yr: 0  Injury with Fall? 0  Risk for fall due to : No Fall Risks  Follow up Falls evaluation completed     Most recent depression screenings:    08/17/2021   10:42 AM 07/08/2020   10:46 AM  PHQ 2/9 Scores  PHQ - 2 Score 0 0  PHQ- 9 Score 5     {VISON DENTAL STD PSA (Optional):27386}  {History (Optional):23778}  Patient Care Team: Mahalie Kanner, NP as PCP - General (Nurse Practitioner)   Outpatient Medications Prior to Visit  Medication Sig   fluticasone (FLONASE) 50 MCG/ACT nasal spray Place 2 sprays into both nostrils in the morning and at bedtime. After 7 days, reduce to once daily.   norgestimate-ethinyl estradiol (SPRINTEC 28) 0.25-35 MG-MCG tablet Take 1 tablet by mouth daily.   Nystatin POWD Apply liberally to affected area 2 times per day   spironolactone (ALDACTONE) 100 MG tablet Take 1 tablet (100 mg total) by mouth daily.   No facility-administered medications prior to visit.    ROS        Objective:     There were no vitals taken for this visit. {Vitals History (Optional):23777}  Physical Exam   No results found for any visits on 09/22/21. {Show previous labs (optional):23779}    Assessment & Plan:    Routine Health Maintenance and Physical Exam  Immunization History  Administered Date(s) Administered   DTaP 02/23/1999, 04/21/1999,  06/30/1999, 03/15/2000, 09/29/2003   Hepatitis A 07/26/2007, 07/31/2008   Hepatitis B 12/11/1998, 01/18/1999, 06/30/1999   HiB (PRP-OMP) 02/23/1999, 04/21/1999, 06/30/1999, 03/15/2000   IPV 02/23/1999, 04/21/1999, 12/19/1999, 09/29/2003   Influenza,inj,Quad PF,6+ Mos 10/31/2013   Influenza-Unspecified 01/31/2012   MMR 12/18/2000, 09/29/2003   Meningococcal Polysaccharide 07/31/2011   Pneumococcal Conjugate-13 03/15/2000   Pneumococcal-Unspecified 06/30/1999, 09/13/1999   Tdap 07/31/2011   Varicella 12/19/1999, 07/26/2007    Health Maintenance  Topic Date Due   HIV Screening  Never done   Hepatitis C Screening  Never done   INFLUENZA VACCINE  09/20/2021   PAP-Cervical Cytology Screening  09/22/2021 (Originally 12/10/2019)   PAP SMEAR-Modifier  09/22/2021 (Originally 12/10/2019)   TETANUS/TDAP  09/22/2021 (Originally 07/30/2021)   HPV VACCINES  Discontinued   COVID-19 Vaccine  Discontinued    Discussed health benefits of physical activity, and encouraged her to engage in regular exercise appropriate for her age and condition.  Problem List Items Addressed This Visit   None Visit Diagnoses     Annual physical exam    -  Primary   Cervical cancer screening       Need for Tdap vaccination          No follow-ups on file.     Kyrian Stage, NP
